# Patient Record
Sex: Male | Born: 1970 | Race: White | Hispanic: No | Marital: Married | Smoking: Never smoker
Health system: Southern US, Community
[De-identification: ages and names within clinical notes are randomized; demographics above are authoritative.]

## PROBLEM LIST (undated history)

## (undated) DIAGNOSIS — C6290 Malignant neoplasm of unspecified testis, unspecified whether descended or undescended: Secondary | ICD-10-CM

## (undated) DIAGNOSIS — N5089 Other specified disorders of the male genital organs: Secondary | ICD-10-CM

---

## 1998-01-11 HISTORY — PX: APPENDECTOMY: SHX54

## 1999-04-29 ENCOUNTER — Encounter: Payer: Self-pay | Admitting: Specialist

## 1999-04-29 ENCOUNTER — Ambulatory Visit (HOSPITAL_COMMUNITY): Admission: RE | Admit: 1999-04-29 | Discharge: 1999-04-29 | Payer: Self-pay | Admitting: Specialist

## 2013-01-26 ENCOUNTER — Encounter (HOSPITAL_BASED_OUTPATIENT_CLINIC_OR_DEPARTMENT_OTHER): Payer: Self-pay | Admitting: Emergency Medicine

## 2013-01-26 ENCOUNTER — Emergency Department (HOSPITAL_BASED_OUTPATIENT_CLINIC_OR_DEPARTMENT_OTHER)
Admission: EM | Admit: 2013-01-26 | Discharge: 2013-01-26 | Disposition: A | Discharge status: Home / Self Care | Payer: Worker's Compensation | Attending: Emergency Medicine | Admitting: Emergency Medicine

## 2013-01-26 ENCOUNTER — Emergency Department (HOSPITAL_BASED_OUTPATIENT_CLINIC_OR_DEPARTMENT_OTHER): Payer: Worker's Compensation

## 2013-01-26 DIAGNOSIS — Y939 Activity, unspecified: Secondary | ICD-10-CM | POA: Insufficient documentation

## 2013-01-26 DIAGNOSIS — Z23 Encounter for immunization: Secondary | ICD-10-CM | POA: Insufficient documentation

## 2013-01-26 DIAGNOSIS — Y990 Civilian activity done for income or pay: Secondary | ICD-10-CM | POA: Insufficient documentation

## 2013-01-26 DIAGNOSIS — W3189XA Contact with other specified machinery, initial encounter: Secondary | ICD-10-CM | POA: Insufficient documentation

## 2013-01-26 DIAGNOSIS — IMO0002 Reserved for concepts with insufficient information to code with codable children: Secondary | ICD-10-CM | POA: Diagnosis present

## 2013-01-26 DIAGNOSIS — S61209A Unspecified open wound of unspecified finger without damage to nail, initial encounter: Secondary | ICD-10-CM | POA: Insufficient documentation

## 2013-01-26 DIAGNOSIS — S61319A Laceration without foreign body of unspecified finger with damage to nail, initial encounter: Secondary | ICD-10-CM

## 2013-01-26 DIAGNOSIS — Y9289 Other specified places as the place of occurrence of the external cause: Secondary | ICD-10-CM | POA: Insufficient documentation

## 2013-01-26 DIAGNOSIS — S61219A Laceration without foreign body of unspecified finger without damage to nail, initial encounter: Secondary | ICD-10-CM

## 2013-01-26 MED ORDER — TETANUS-DIPHTH-ACELL PERTUSSIS 5-2.5-18.5 LF-MCG/0.5 IM SUSP
0.5000 mL | Freq: Once | INTRAMUSCULAR | Status: AC
Start: 2013-01-26 — End: 2013-01-26
  Administered 2013-01-26: 0.5 mL via INTRAMUSCULAR
  Filled 2013-01-26: qty 0.5

## 2013-01-26 MED ORDER — CEPHALEXIN 500 MG PO CAPS: 500.0000 mg | ORAL_CAPSULE | Freq: Four times a day (QID) | ORAL | Status: AC

## 2013-01-26 MED ORDER — OXYCODONE-ACETAMINOPHEN 5-325 MG PO TABS: 1.0000 | ORAL_TABLET | Freq: Four times a day (QID) | ORAL | Status: DC | PRN

## 2013-01-26 NOTE — ED Notes (Signed)
MD at bedside. 

## 2013-01-26 NOTE — ED Provider Notes (Signed)
CSN: [id number]     Arrival date & time 01/26/13  1004 History   First MD Initiated Contact with Patient 01/26/13 1026     Chief Complaint  Patient presents with  . Finger Injury    left index finger   (Consider location/radiation/quality/duration/timing/severity/associated sxs/prior Treatment) Patient is a 43 y.o. male presenting with hand pain. The history is provided by the patient.  Hand Pain This is a new problem. The current episode started 1 to 2 hours ago. Episode frequency: once. The problem has not changed since onset.Pertinent negatives include no chest pain, no abdominal pain, no headaches and no shortness of breath. Nothing aggravates the symptoms. Nothing relieves the symptoms. He has tried nothing for the symptoms. The treatment provided no relief.    History reviewed. No pertinent past medical history. Past Surgical History  Procedure Laterality Date  . Appendectomy     No family history on file. History  Substance Use Topics  . Smoking status: Never Smoker   . Smokeless tobacco: Never Used  . Alcohol Use: No    Review of Systems  Constitutional: Negative for fever.  HENT: Negative for drooling and rhinorrhea.   Eyes: Negative for pain.  Respiratory: Negative for cough and shortness of breath.   Cardiovascular: Negative for chest pain and leg swelling.  Gastrointestinal: Negative for nausea, vomiting, abdominal pain and diarrhea.  Genitourinary: Negative for dysuria and hematuria.  Musculoskeletal: Negative for gait problem and neck pain.  Skin: Negative for color change.  Neurological: Negative for numbness and headaches.  Hematological: Negative for adenopathy.  Psychiatric/Behavioral: Negative for behavioral problems.  All other systems reviewed and are negative.    Allergies  Review of patient's allergies indicates no known allergies.  Home Medications  No current outpatient prescriptions on file. BP 127/90  Temp(Src) 98.1 F (36.7 C) (Oral)   Resp 20  Ht 5\' 10"  (1.778 m)  Wt 190 lb (86.183 kg)  BMI 27.26 kg/m2  SpO2 100% Physical Exam  Nursing note and vitals reviewed. Constitutional: He is oriented to person, place, and time. He appears well-developed and well-nourished.  HENT:  Head: Normocephalic and atraumatic.  Right Ear: External ear normal.  Left Ear: External ear normal.  Nose: Nose normal.  Mouth/Throat: Oropharynx is clear and moist. No oropharyngeal exudate.  Eyes: Conjunctivae and EOM are normal. Pupils are equal, round, and reactive to light.  Neck: Normal range of motion. Neck supple.  Cardiovascular: Normal rate, regular rhythm, normal heart sounds and intact distal pulses.  Exam reveals no gallop and no friction rub.   No murmur heard. Pulmonary/Chest: Effort normal and breath sounds normal. No respiratory distress. He has no wheezes.  Abdominal: Soft. Bowel sounds are normal. He exhibits no distension. There is no tenderness. There is no rebound and no guarding.  Musculoskeletal: Normal range of motion. He exhibits no edema and no tenderness.  Normal range of motion of the digits of the left hand. 2+ distal pulses in the upper extremities. Sensation intact in the left upper extremity.  Avulsion of the base of the nail plate with mild avulsion of tissue from the proximal nail fold.  Approximately 1 cm horizontal laceration horizontally across the nailbed which is difficult to fully characterize w/ the nail plate intact.   Neurological: He is alert and oriented to person, place, and time.  Skin: Skin is warm and dry.  Psychiatric: He has a normal mood and affect. His behavior is normal.    ED Course  Procedures (including critical care  time) Labs Review Labs Reviewed - No data to display Imaging Review Dg Hand Complete Left  01/26/2013   CLINICAL DATA:  Pain status post trauma. Patient unable to remove wedding ring  EXAM: LEFT HAND - COMPLETE 3+ VIEW  COMPARISON:  None.  FINDINGS: There is no evidence  of fracture or dislocation. There is no evidence of arthropathy or other focal bone abnormality. Soft tissues are unremarkable.  IMPRESSION: No evidence of acute osseous abnormalities within the visualized osseous structures.   Electronically Signed   By: Margaree Mackintosh M.D.   On: 01/26/2013 11:17    EKG Interpretation   None      Left hand, index finger    MDM   1. Finger laceration   2. Laceration of nail bed of finger   3. Avulsion of nail plate    27:03 AM 43 y.o. male who presents with a left index finger injury which occurred prior to arrival. The patient states that he caught his finger in a printing machine mechanism. It caused an avulsion of the base of the nail plate of his left index finger. The patient has only minimal pain on exam and refuses any pain medicine. He does not remember his last tetanus shot so I will update this today. He is neurovascularly intact on exam. Will get plain films to rule out fracture.   No fx noted on imaging. I discussed the treatment options with the patient. I told him that I would like to remove the nail plate so that I can further examine the nailbed of his lacerated finger. That way I could further evaluate the laceration and repair it. He refuses removal of the nail and does not want me to anesthetize his finger. I told him that not doing this will likely effect regrowth of the nail as well as cosmesis. He understands and would not like me to repair the nailbed laceration. I irrigated the wound thoroughly w/ sterile water. Will apply bacitracin, bandage, and finger splint. Will provide keflex to help prevent infection.     I have discussed the diagnosis/risks/treatment options with the patient and believe the pt to be eligible for discharge home to follow-up with the hand specialist if he wants. We also discussed returning to the ED immediately if new or worsening sx occur. We discussed the sx which are most concerning (e.g., worsening pain,  concern for infection) that necessitate immediate return. Any new prescriptions provided to the patient are listed below.  Discharge Medication List as of 01/26/2013 12:09 PM    START taking these medications   Details  cephALEXin (KEFLEX) 500 MG capsule Take 1 capsule (500 mg total) by mouth 4 (four) times daily., Starting 01/26/2013, Last dose on Mon 02/05/13, Print    oxyCODONE-acetaminophen (PERCOCET) 5-325 MG per tablet Take 1 tablet by mouth every 6 (six) hours as needed for moderate pain., Starting 01/26/2013, Until Discontinued, Print          Blanchard Kelch, MD 01/27/13 [phone]

## 2013-01-26 NOTE — ED Notes (Signed)
Finger cleansed with NS, Bacitracin applied.  Finger splint applied per MD order.

## 2013-01-26 NOTE — ED Notes (Signed)
Patient states he caught his left index finger in a machine, ripping off part of the nail bed.

## 2014-05-14 ENCOUNTER — Other Ambulatory Visit: Payer: Self-pay | Admitting: Urology

## 2014-05-14 ENCOUNTER — Encounter (HOSPITAL_BASED_OUTPATIENT_CLINIC_OR_DEPARTMENT_OTHER): Payer: Self-pay | Admitting: *Deleted

## 2014-05-17 ENCOUNTER — Encounter (HOSPITAL_BASED_OUTPATIENT_CLINIC_OR_DEPARTMENT_OTHER): Payer: Self-pay | Admitting: *Deleted

## 2014-05-17 NOTE — H&P (Signed)
History of Present Illness   Dominic Anderson presents today at the consultation request from Corine Shelter, [place] at [facility] for further assessment of some right-sided testicular pain and swelling. Dominic Anderson is currently 44 years of age. He generally enjoys good health. He has no prior urologic history. Several months ago he began noticing some discomfort in his right hemi-scrotum/testicle. The pain was never severe, but was often an annoying pressure or discomfort. He was evaluated at [facility] and given a course of antiinflammatories. He was also given a prescription for an antibiotic to take if the antiinflammatory was not helpful. After several weeks he persisted in having at least some intermittent discomfort and, therefore, he took the antibiotic. Overall, the pain has not been as bad recently, but he has had a substantial increase in swelling over the last month in the right hemi-scrotum. No other voiding complaints or systemic concerns.      Past Medical History Problems  1. History of No significant past medical history  Surgical History Problems  1. History of Appendectomy  Current Meds 1. Advil TABS;  Therapy: (Recorded:29Apr2016) to Recorded  Allergies Medication  1. No Known Drug Allergies  Family History Problems  1. Family history of diabetes mellitus (Z83.3) : Father  Social History Problems    Alcohol use (Z78.9)   Caffeine use (F15.90)   Never a smoker   Number of children   Occupation   Separated from significant other (Z63.5)  Review of Systems Genitourinary, constitutional, skin, eye, otolaryngeal, hematologic/lymphatic, cardiovascular, pulmonary, endocrine, musculoskeletal, gastrointestinal, neurological and psychiatric system(s) were reviewed and pertinent findings if present are noted and are otherwise negative.  Genitourinary: testicular pain.    Vitals Vital Signs [Data Includes: Last 1 Day]  Recorded: 29Apr2016 01:36PM  Height: 5 ft 10  in Weight: 199 lb  BMI Calculated: 28.55 BSA Calculated: 2.08 Blood Pressure: 118 / 78 Heart Rate: 75  Physical Exam Constitutional: Well nourished and well developed . No acute distress.  ENT:. The ears and nose are normal in appearance.  Neck: The appearance of the neck is normal and no neck mass is present.  Pulmonary: No respiratory distress and normal respiratory rhythm and effort.  Cardiovascular: Heart rate and rhythm are normal . No peripheral edema.  Abdomen: The abdomen is soft and nontender. No masses are palpated. No CVA tenderness. No hernias are palpable. No hepatosplenomegaly noted.  Genitourinary: The scrotum is normal in appearance. Examination of the right scrotum demonstrates a hydrocele. The right epididymis is enlarged. The right testis is found to have a 6 cm right testicular mass. The left testis is normal.  Skin: Normal skin turgor, no visible rash and no visible skin lesions.  Neuro/Psych:. Mood and affect are appropriate.    Results/Data Urine [Data Includes: Last 1 Day]   29Apr2016  COLOR YELLOW   APPEARANCE CLEAR   SPECIFIC GRAVITY >1.030   pH 5.5   GLUCOSE NEG mg/dL  BILIRUBIN NEG   KETONE NEG mg/dL  BLOOD TRACE   PROTEIN NEG mg/dL  UROBILINOGEN 0.2 mg/dL  NITRITE NEG   LEUKOCYTE ESTERASE NEG   SQUAMOUS EPITHELIAL/HPF RARE   WBC 0-2 WBC/hpf  RBC 0-2 RBC/hpf  BACTERIA RARE   CRYSTALS NONE SEEN   CASTS NONE SEEN     Scrotal ultrasonography was performed today. The right testicle measured 6.5 x 5 cm. It was markedly inhomogeneous with areas of increased as well as reduced echogenicity. Multiple cystic areas were also noted. I was unable to  appreciate any significant normal testicular parenchyma. 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 No appreciable fluid noted. The left testicle measured 5.1 by approximately 2.8 cm. That testicle is completely homogenous in appearance and completely normal. 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    Assessment Assessed  1. Testicular pain (N50.8) 2. 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 SCROTAL U/S; Status:Resulted - Requires Verification;   Done: 29Apr2016 03:14PM  Discussion/Summary   Dominic Anderson has a markedly abnormal right testicle based on clinical exams as well as scrotal ultrasonography today. The radiographic images are consistent with testicular neoplasm. 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 We will obtain alpha-fetoprotein, LDH, and beta hCG levels today. I have explained to him that the standard of care in a situation like this where there is a markedly abnormal testis suspicious for germ-cell tumor to go ahead with a right radical/inguinal orchiectomy. Status post that procedure we would proceed with CT imaging of the abdomen and pelvis as well as probable chest. He is told that after that decision tree can get more complicated depending on whether this is a pure seminoma or mixed germ-cell tumor and what the components/constituents of the tumor are. Other factors would obviously include the radiographic staging and his response if markers are elevated to the surgical procedure. He does go to church with Dr Collier Salina _____ who will be an excellent oncologist for him if he does need that assistance/expertise. We will attempt to get something scheduled certainly over the next 1-2 weeks. We talked about some recovery issues and potential issues with regard to work.   cc: Corine Shelter, [place]      Signatures Electronically signed by : Rana Snare, M.D.; May 14 2014  6:00PM EST

## 2014-05-17 NOTE — Progress Notes (Signed)
To [facility] at 0630-Hg on arrival-Instructed Npo after MN-

## 2014-05-17 NOTE — Progress Notes (Signed)
Message left with [facility] have been unable to contact [place] his procedure on 05/20/14.

## 2014-05-20 ENCOUNTER — Ambulatory Visit (HOSPITAL_BASED_OUTPATIENT_CLINIC_OR_DEPARTMENT_OTHER): Payer: BLUE CROSS/BLUE SHIELD | Admitting: Anesthesiology

## 2014-05-20 ENCOUNTER — Ambulatory Visit (HOSPITAL_BASED_OUTPATIENT_CLINIC_OR_DEPARTMENT_OTHER)
Admission: RE | Admit: 2014-05-20 | Discharge: 2014-05-20 | Disposition: A | Discharge status: Home / Self Care | Payer: BLUE CROSS/BLUE SHIELD | Source: Ambulatory Visit | Attending: Urology | Admitting: Urology

## 2014-05-20 ENCOUNTER — Encounter (HOSPITAL_BASED_OUTPATIENT_CLINIC_OR_DEPARTMENT_OTHER)
Admission: RE | Disposition: A | Discharge status: Home / Self Care | Payer: Self-pay | Source: Ambulatory Visit | Attending: Urology

## 2014-05-20 ENCOUNTER — Encounter (HOSPITAL_BASED_OUTPATIENT_CLINIC_OR_DEPARTMENT_OTHER): Payer: Self-pay | Admitting: *Deleted

## 2014-05-20 DIAGNOSIS — C6290 Malignant neoplasm of unspecified testis, unspecified whether descended or undescended: Secondary | ICD-10-CM

## 2014-05-20 DIAGNOSIS — Z791 Long term (current) use of non-steroidal anti-inflammatories (NSAID): Secondary | ICD-10-CM | POA: Diagnosis not present

## 2014-05-20 DIAGNOSIS — C6291 Malignant neoplasm of right testis, unspecified whether descended or undescended: Secondary | ICD-10-CM | POA: Insufficient documentation

## 2014-05-20 DIAGNOSIS — N508 Other specified disorders of male genital organs: Secondary | ICD-10-CM | POA: Diagnosis present

## 2014-05-20 DIAGNOSIS — N433 Hydrocele, unspecified: Secondary | ICD-10-CM | POA: Diagnosis not present

## 2014-05-20 HISTORY — DX: Other specified disorders of the male genital organs: N50.89

## 2014-05-20 HISTORY — PX: ORCHIECTOMY: SHX2116

## 2014-05-20 LAB — POCT HEMOGLOBIN-HEMACUE: HEMOGLOBIN: 16.9 g/dL (ref 13.0–17.0)

## 2014-05-20 SURGERY — ORCHIECTOMY
Anesthesia: General | Laterality: Right

## 2014-05-20 MED ORDER — HYDROCODONE-ACETAMINOPHEN 5-325 MG PO TABS: 1.0000 | ORAL_TABLET | Freq: Four times a day (QID) | ORAL | Status: DC | PRN

## 2014-05-20 MED ORDER — MIDAZOLAM HCL 5 MG/5ML IJ SOLN
INTRAMUSCULAR | Status: DC | PRN
  Administered 2014-05-20 (×2): 1 mg via INTRAVENOUS

## 2014-05-20 MED ORDER — FENTANYL CITRATE (PF) 100 MCG/2ML IJ SOLN
INTRAMUSCULAR | Status: DC | PRN
  Administered 2014-05-20 (×9): 25 ug via INTRAVENOUS
  Administered 2014-05-20: 50 ug via INTRAVENOUS
  Administered 2014-05-20: 25 ug via INTRAVENOUS

## 2014-05-20 MED ORDER — CEFAZOLIN SODIUM-DEXTROSE 2-3 GM-% IV SOLR
INTRAVENOUS | Status: AC
  Filled 2014-05-20: qty 50

## 2014-05-20 MED ORDER — DEXAMETHASONE SODIUM PHOSPHATE 10 MG/ML IJ SOLN
INTRAMUSCULAR | Status: DC | PRN
  Administered 2014-05-20: 10 mg via INTRAVENOUS

## 2014-05-20 MED ORDER — BUPIVACAINE HCL (PF) 0.25 % IJ SOLN
INTRAMUSCULAR | Status: DC | PRN
  Administered 2014-05-20: 14 mL

## 2014-05-20 MED ORDER — LACTATED RINGERS IV SOLN
INTRAVENOUS | Status: DC
  Administered 2014-05-20 (×3): via INTRAVENOUS
  Filled 2014-05-20: qty 1000

## 2014-05-20 MED ORDER — HYDROMORPHONE HCL 1 MG/ML IJ SOLN
0.2500 mg | INTRAMUSCULAR | Status: DC | PRN
  Filled 2014-05-20: qty 1

## 2014-05-20 MED ORDER — MIDAZOLAM HCL 2 MG/2ML IJ SOLN
INTRAMUSCULAR | Status: AC
  Filled 2014-05-20: qty 2

## 2014-05-20 MED ORDER — KETOROLAC TROMETHAMINE 30 MG/ML IJ SOLN
30.0000 mg | Freq: Once | INTRAMUSCULAR | Status: DC | PRN
  Filled 2014-05-20: qty 1

## 2014-05-20 MED ORDER — OXYCODONE HCL 5 MG PO TABS
5.0000 mg | ORAL_TABLET | Freq: Once | ORAL | Status: DC | PRN
  Filled 2014-05-20: qty 1

## 2014-05-20 MED ORDER — CEFAZOLIN SODIUM-DEXTROSE 2-3 GM-% IV SOLR
2.0000 g | INTRAVENOUS | Status: AC
  Administered 2014-05-20: 2 g via INTRAVENOUS
  Filled 2014-05-20: qty 50

## 2014-05-20 MED ORDER — FENTANYL CITRATE (PF) 100 MCG/2ML IJ SOLN
INTRAMUSCULAR | Status: AC
  Filled 2014-05-20: qty 6

## 2014-05-20 MED ORDER — OXYCODONE HCL 5 MG/5ML PO SOLN
5.0000 mg | Freq: Once | ORAL | Status: DC | PRN
  Filled 2014-05-20: qty 5

## 2014-05-20 MED ORDER — ONDANSETRON HCL 4 MG/2ML IJ SOLN
INTRAMUSCULAR | Status: DC | PRN
  Administered 2014-05-20: 4 mg via INTRAVENOUS

## 2014-05-20 MED ORDER — CEFAZOLIN SODIUM 1-5 GM-% IV SOLN
1.0000 g | INTRAVENOUS | Status: DC
  Filled 2014-05-20: qty 50

## 2014-05-20 MED ORDER — GLYCOPYRROLATE 0.2 MG/ML IJ SOLN
INTRAMUSCULAR | Status: DC | PRN
  Administered 2014-05-20: 0.2 mg via INTRAVENOUS

## 2014-05-20 MED ORDER — PROMETHAZINE HCL 25 MG/ML IJ SOLN
6.2500 mg | INTRAMUSCULAR | Status: DC | PRN
  Filled 2014-05-20: qty 1

## 2014-05-20 MED ORDER — ACETAMINOPHEN 10 MG/ML IV SOLN
INTRAVENOUS | Status: DC | PRN
  Administered 2014-05-20: 1000 mg via INTRAVENOUS

## 2014-05-20 MED ORDER — LIDOCAINE HCL (CARDIAC) 20 MG/ML IV SOLN
INTRAVENOUS | Status: DC | PRN
  Administered 2014-05-20: 100 mg via INTRAVENOUS

## 2014-05-20 MED ORDER — PROPOFOL 10 MG/ML IV BOLUS
INTRAVENOUS | Status: DC | PRN
  Administered 2014-05-20: 200 mg via INTRAVENOUS

## 2014-05-20 MED ORDER — KETOROLAC TROMETHAMINE 30 MG/ML IJ SOLN
INTRAMUSCULAR | Status: DC | PRN
  Administered 2014-05-20: 30 mg via INTRAVENOUS

## 2014-05-20 SURGICAL SUPPLY — 69 items
APL SKNCLS STERI-STRIP NONHPOA (GAUZE/BANDAGES/DRESSINGS)
BENZOIN TINCTURE PRP APPL 2/3 (GAUZE/BANDAGES/DRESSINGS) IMPLANT
BLADE CLIPPER SURG (BLADE) ×3 IMPLANT
BLADE SURG 15 STRL LF DISP TIS (BLADE) ×1 IMPLANT
BLADE SURG 15 STRL SS (BLADE) ×3
BNDG GAUZE ELAST 4 BULKY (GAUZE/BANDAGES/DRESSINGS) ×3 IMPLANT
CLEANER CAUTERY TIP 5X5 PAD (MISCELLANEOUS) ×1 IMPLANT
CLEANER INSTRU ENZYM VALS 1GL (STERILIZATION PRODUCTS) ×3 IMPLANT
CLOSURE WOUND 1/2 X4 (GAUZE/BANDAGES/DRESSINGS) ×1
CLOTH BEACON ORANGE TIMEOUT ST (SAFETY) ×1 IMPLANT
COVER BACK TABLE 60X90IN (DRAPES) ×3 IMPLANT
COVER MAYO STAND STRL (DRAPES) ×3 IMPLANT
DISSECTOR ROUND CHERRY 3/8 STR (MISCELLANEOUS) ×2 IMPLANT
DRAIN PENROSE 18X1/4 LTX STRL (WOUND CARE) ×2 IMPLANT
DRAPE LAPAROTOMY TRNSV 102X78 (DRAPE) IMPLANT
DRAPE PED LAPAROTOMY (DRAPES) ×3 IMPLANT
DRSG TEGADERM 2-3/8X2-3/4 SM (GAUZE/BANDAGES/DRESSINGS) ×3 IMPLANT
DRSG TEGADERM 4X4.75 (GAUZE/BANDAGES/DRESSINGS) ×2 IMPLANT
ELECT NDL TIP 2.8 STRL (NEEDLE) ×1 IMPLANT
ELECT NEEDLE TIP 2.8 STRL (NEEDLE) ×3 IMPLANT
ELECT REM PT RETURN 9FT ADLT (ELECTROSURGICAL) ×3
ELECTRODE REM PT RTRN 9FT ADLT (ELECTROSURGICAL) ×1 IMPLANT
GLOVE BIO SURGEON STRL SZ7.5 (GLOVE) ×3 IMPLANT
GLOVE BIOGEL M 6.5 STRL (GLOVE) ×2 IMPLANT
GLOVE BIOGEL M STRL SZ7.5 (GLOVE) ×6 IMPLANT
GLOVE BIOGEL PI IND STRL 6.5 (GLOVE) ×1 IMPLANT
GLOVE BIOGEL PI IND STRL 7.5 (GLOVE) IMPLANT
GLOVE BIOGEL PI INDICATOR 6.5 (GLOVE) ×2
GLOVE BIOGEL PI INDICATOR 7.5 (GLOVE) ×2
GOWN L4 LG 24 PK N/S (GOWN DISPOSABLE) IMPLANT
GOWN STRL REUS W/ TWL LRG LVL3 (GOWN DISPOSABLE) ×1 IMPLANT
GOWN STRL REUS W/ TWL XL LVL3 (GOWN DISPOSABLE) IMPLANT
GOWN STRL REUS W/TWL LRG LVL3 (GOWN DISPOSABLE)
GOWN STRL REUS W/TWL XL LVL3 (GOWN DISPOSABLE) ×6
NDL HYPO 25X1 1.5 SAFETY (NEEDLE) ×1 IMPLANT
NEEDLE HYPO 25X1 1.5 SAFETY (NEEDLE) ×3 IMPLANT
NS IRRIG 500ML POUR BTL (IV SOLUTION) IMPLANT
PACK BASIN DAY SURGERY FS (CUSTOM PROCEDURE TRAY) ×3 IMPLANT
PAD CLEANER CAUTERY TIP 5X5 (MISCELLANEOUS) ×2
PENCIL BUTTON HOLSTER BLD 10FT (ELECTRODE) ×3 IMPLANT
STRIP CLOSURE SKIN 1/2X4 (GAUZE/BANDAGES/DRESSINGS) ×2 IMPLANT
SUPPORT SCROTAL LG STRP (MISCELLANEOUS) ×2 IMPLANT
SUPPORTER ATHLETIC LG (MISCELLANEOUS) ×1
SUT PROLENE 4 0 RB 1 (SUTURE)
SUT PROLENE 4-0 RB1 .5 CRCL 36 (SUTURE) IMPLANT
SUT SILK 0 SH 30 (SUTURE) ×6 IMPLANT
SUT SILK 0 TIES 10X30 (SUTURE) ×3 IMPLANT
SUT VIC AB 0 CT1 36 (SUTURE) ×8 IMPLANT
SUT VIC AB 2-0 CT1 (SUTURE) ×4 IMPLANT
SUT VIC AB 2-0 CT1 27 (SUTURE)
SUT VIC AB 2-0 CT1 TAPERPNT 27 (SUTURE) IMPLANT
SUT VIC AB 3-0 CT1 36 (SUTURE) IMPLANT
SUT VIC AB 3-0 SH 27 (SUTURE) ×3
SUT VIC AB 3-0 SH 27X BRD (SUTURE) ×1 IMPLANT
SUT VIC AB 4-0 BRD 54 (SUTURE) IMPLANT
SUT VIC AB 4-0 P-3 18XBRD (SUTURE) IMPLANT
SUT VIC AB 4-0 P3 18 (SUTURE)
SUT VIC AB 4-0 RB1 18 (SUTURE) IMPLANT
SUT VIC AB 5-0 P-3 18X BRD (SUTURE) IMPLANT
SUT VIC AB 5-0 P3 18 (SUTURE)
SUT VICRYL 4-0 PS2 18IN ABS (SUTURE) ×3 IMPLANT
SYR CONTROL 10ML LL (SYRINGE) ×3 IMPLANT
TAPE STRIPS DRAPE STRL (GAUZE/BANDAGES/DRESSINGS) ×2 IMPLANT
TOWEL OR 17X24 6PK STRL BLUE (TOWEL DISPOSABLE) ×6 IMPLANT
TRAY DSU PREP LF (CUSTOM PROCEDURE TRAY) ×3 IMPLANT
TUBE CONNECTING 12'X1/4 (SUCTIONS) ×1
TUBE CONNECTING 12X1/4 (SUCTIONS) ×2 IMPLANT
WATER STERILE IRR 500ML POUR (IV SOLUTION) IMPLANT
YANKAUER SUCT BULB TIP NO VENT (SUCTIONS) ×3 IMPLANT

## 2014-05-20 NOTE — Anesthesia Postprocedure Evaluation (Signed)
  Anesthesia Post-op Note  Patient: Dominic Anderson  Procedure(s) Performed: Procedure(s): RADICAL ORCHIECTOMY RIGHT  (Right)  Patient Location: PACU  Anesthesia Type:General  Level of Consciousness: awake and alert   Airway and Oxygen Therapy: Patient Spontanous Breathing  Post-op Pain: mild  Post-op Assessment: Post-op Vital signs reviewed  Post-op Vital Signs: Reviewed  Last Vitals:  Filed Vitals:   05/20/14 1130  BP: 126/69  Pulse: 68  Temp: 36.4 C  Resp: 18    Complications: No apparent anesthesia complications

## 2014-05-20 NOTE — Op Note (Signed)
Preoperative diagnosis: Right testicular mass Postoperative diagnosis: Same Procedure: Right radical orchiectomy   Surgeon: Bernestine Amass M.D.  Anesthesia: Gen.  Indications Mr. Burdette is 44 years of age. He presented recently as a referral to our office secondary to several months of some right-sided testicular pain and swelling. Clinical exam was very concerning for testicular malignancy. Ultrasound revealed a 7 cm inhomogeneous mass replacing the right testicle. Subsequent alpha-fetoprotein level was noted to be 3500. The patient now presents for right inguinal radical orchiectomy. Risks benefits discussed with the patient. Full informed consent obtained.     Technique and findings: Patient was brought the operating room where he had successful induction of general anesthesia. He was placed in supine position and prepped and draped in usual manner. Appropriate surgical timeout was performed. Right inguinal incision was performed over the level of the internal ring. Incision was carried down to the external oblique fascia. External ring was identified and then opened in the direction of its fibers. Ilioinguinal nerve was identified and preserved. The spermatic cord itself appeared unremarkable. At the level of the pubic tubercle it was encircled with a Penrose drain. The right testicle was delivered and separated from gubernacular structures. Evaluation revealed a very firm mass essentially replacing any normal testis. The clamps spermatic cord was then taken up to the internal ring. There was divided into 2 bundles. Suture ligature was performed with silk suture. Ties were left purposely long for potential identification. Copious irrigation of the inguinal wound was performed. Marcaine was instilled in the floor of the inguinal canal and also in the skin. External oblique fascia was closed with a running 0 Vicryl suture. Superficial fascia was closed with a running Vicryl suture and skin closed with a  subcuticular running suture. No obvious Occasions or problems occurred and the patient was brought to recovery room in stable condition.

## 2014-05-20 NOTE — Transfer of Care (Signed)
  Last Vitals:  Filed Vitals:   05/20/14 0652  BP: 123/69  Pulse: 66  Temp: 36.4 C  Resp: 16   Immediate Anesthesia Transfer of Care Note  Patient: Dominic Anderson  Procedure(s) Performed: Procedure(s) (LRB): RADICAL ORCHIECTOMY RIGHT  (Right)  Patient Location: PACU  Anesthesia Type: General  Level of Consciousness: sleepy  Airway & Oxygen Therapy: Patient Spontanous Breathing and Patient connected to face mask oxygen, oral airway in place  Post-op Assessment: Report given to PACU RN and Post -op Vital signs reviewed and stable  Post vital signs: Reviewed and stable  Complications: No apparent anesthesia complications

## 2014-05-20 NOTE — Anesthesia Procedure Notes (Signed)
Procedure Name: LMA Insertion Date/Time: 05/20/2014 7:57 AM Performed by: Justice Rocher Pre-anesthesia Checklist: Patient identified, Emergency Drugs available, Suction available and Patient being monitored Patient Re-evaluated:Patient Re-evaluated prior to inductionOxygen Delivery Method: Circle System Utilized Preoxygenation: Pre-oxygenation with 100% oxygen Intubation Type: IV induction Ventilation: Mask ventilation without difficulty LMA: LMA inserted LMA Size: 5.0 Number of attempts: 1 Airway Equipment and Method: Bite block Placement Confirmation: positive ETCO2 Tube secured with: Tape Dental Injury: Teeth and Oropharynx as per pre-operative assessment

## 2014-05-20 NOTE — Interval H&P Note (Signed)
History and Physical Interval Note:  05/20/2014 7:47 AM  Dominic Anderson  has presented today for surgery, with the diagnosis of RIGHT TESTICULAR MASS  The various methods of treatment have been discussed with the patient and family. After consideration of risks, benefits and other options for treatment, the patient has consented to  Procedure(s): ORCHIECTOMY RIGHT  (Right) as a surgical intervention .  The patient's history has been reviewed, patient examined, no change in status, stable for surgery.  I have reviewed the patient's chart and labs.  Questions were answered to the patient's satisfaction.     Elzena Muston S

## 2014-05-20 NOTE — Discharge Instructions (Addendum)
No driving 5 days Keep ice on inguinal and scrotal area over the next 48 hours No lifting greater than 10-15 pounds 6 weeks May resume light duty activity in one week. Ambulation encouraged. Be sure to take a stool softener if you're taking the pain medication. May shower after 48 hours. Can remove clear plastic dressing in 48-72 hours. Leave Steri-Strips on until falls off    Call your surgeon if you experience:   1.  Fever over 101.0. 2.  Inability to urinate. 3.  Nausea and/or vomiting. 4.  Extreme swelling or bruising at the surgical site. 5.  Continued bleeding from the incision. 6.  Increased pain, redness or drainage from the incision. 7.  Problems related to your pain medication. 8. Any change in color, movement and/or sensation 9. Any problems and/or concerns    Post Anesthesia Home Care Instructions  Activity: Get plenty of rest for the remainder of the day. A responsible adult should stay with you for 24 hours following the procedure.  For the next 24 hours, DO NOT: -Drive a car -Paediatric nurse -Drink alcoholic beverages -Take any medication unless instructed by your physician -Make any legal decisions or sign important papers.  Meals: Start with liquid foods such as gelatin or soup. Progress to regular foods as tolerated. Avoid greasy, spicy, heavy foods. If nausea and/or vomiting occur, drink only clear liquids until the nausea and/or vomiting subsides. Call your physician if vomiting continues.  Special Instructions/Symptoms: Your throat may feel dry or sore from the anesthesia or the breathing tube placed in your throat during surgery. If this causes discomfort, gargle with warm salt water. The discomfort should disappear within 24 hours.  If you had a scopolamine patch placed behind your ear for the management of post- operative nausea and/or vomiting:  1. The medication in the patch is effective for 72 hours, after which it should be removed.  Wrap  patch in a tissue and discard in the trash. Wash hands thoroughly with soap and water. 2. You may remove the patch earlier than 72 hours if you experience unpleasant side effects which may include dry mouth, dizziness or visual disturbances. 3. Avoid touching the patch. Wash your hands with soap and water after contact with the patch.

## 2014-05-20 NOTE — Anesthesia Preprocedure Evaluation (Addendum)
Anesthesia Evaluation  Patient identified by MRN, date of birth, ID band Patient awake    Reviewed: Allergy & Precautions, NPO status , Patient's Chart, lab work & pertinent test results  Airway Mallampati: II  TM Distance: >3 FB Neck ROM: Full    Dental  (+) Teeth Intact, Dental Advisory Given   Pulmonary neg pulmonary ROS,  breath sounds clear to auscultation        Cardiovascular negative cardio ROS  Rhythm:Regular Rate:Normal     Neuro/Psych negative neurological ROS     GI/Hepatic negative GI ROS, Neg liver ROS,   Endo/Other  negative endocrine ROS  Renal/GU negative Renal ROS   Testicular mass    Musculoskeletal   Abdominal   Peds  Hematology negative hematology ROS (+)   Anesthesia Other Findings   Reproductive/Obstetrics                            Anesthesia Physical Anesthesia Plan  ASA: II  Anesthesia Plan: General   Post-op Pain Management:    Induction: Intravenous  Airway Management Planned: LMA  Additional Equipment:   Intra-op Plan:   Post-operative Plan:   Informed Consent: I have reviewed the patients History and Physical, chart, labs and discussed the procedure including the risks, benefits and alternatives for the proposed anesthesia with the patient or authorized representative who has indicated his/her understanding and acceptance.   Dental advisory given  Plan Discussed with: CRNA  Anesthesia Plan Comments:         Anesthesia Quick Evaluation

## 2014-05-21 ENCOUNTER — Encounter (HOSPITAL_BASED_OUTPATIENT_CLINIC_OR_DEPARTMENT_OTHER): Payer: Self-pay | Admitting: Urology

## 2014-05-29 ENCOUNTER — Telehealth: Payer: Self-pay | Admitting: Hematology & Oncology

## 2014-05-29 NOTE — Telephone Encounter (Signed)
I spoke w NEW PATIENT today to remind them of their appointment with Dr. Ennever. Also, advised them to bring all medication bottles and insurance card information. ° °

## 2014-06-13 ENCOUNTER — Telehealth: Payer: Self-pay | Admitting: Hematology & Oncology

## 2014-06-13 NOTE — Telephone Encounter (Signed)
I spoke w NEW PATIENT today to remind them of their appointment with Dr. Ennever. Also, advised them to bring all medication bottles and insurance card information. ° °

## 2014-06-14 ENCOUNTER — Ambulatory Visit: Payer: BLUE CROSS/BLUE SHIELD

## 2014-06-14 ENCOUNTER — Other Ambulatory Visit (HOSPITAL_BASED_OUTPATIENT_CLINIC_OR_DEPARTMENT_OTHER): Payer: BLUE CROSS/BLUE SHIELD

## 2014-06-14 ENCOUNTER — Other Ambulatory Visit: Payer: Self-pay

## 2014-06-14 ENCOUNTER — Encounter: Payer: Self-pay | Admitting: Hematology & Oncology

## 2014-06-14 ENCOUNTER — Ambulatory Visit (HOSPITAL_BASED_OUTPATIENT_CLINIC_OR_DEPARTMENT_OTHER): Payer: BLUE CROSS/BLUE SHIELD | Admitting: Hematology & Oncology

## 2014-06-14 VITALS — BP 133/79 | HR 90 | Temp 97.5°F | Resp 16 | Ht 70.0 in | Wt 202.0 lb

## 2014-06-14 DIAGNOSIS — C779 Secondary and unspecified malignant neoplasm of lymph node, unspecified: Secondary | ICD-10-CM | POA: Diagnosis not present

## 2014-06-14 DIAGNOSIS — C6290 Malignant neoplasm of unspecified testis, unspecified whether descended or undescended: Secondary | ICD-10-CM

## 2014-06-14 DIAGNOSIS — C6291 Malignant neoplasm of right testis, unspecified whether descended or undescended: Secondary | ICD-10-CM

## 2014-06-14 LAB — CBC WITH DIFFERENTIAL (CANCER CENTER ONLY)
BASO#: 0 10*3/uL (ref 0.0–0.2)
BASO%: 0.6 % (ref 0.0–2.0)
EOS ABS: 0.2 10*3/uL (ref 0.0–0.5)
EOS%: 2.5 % (ref 0.0–7.0)
HCT: 45.3 % (ref 38.7–49.9)
HGB: 15.9 g/dL (ref 13.0–17.1)
LYMPH#: 2 10*3/uL (ref 0.9–3.3)
LYMPH%: 28.9 % (ref 14.0–48.0)
MCH: 32.7 pg (ref 28.0–33.4)
MCHC: 35.1 g/dL (ref 32.0–35.9)
MCV: 93 fL (ref 82–98)
MONO#: 0.7 10*3/uL (ref 0.1–0.9)
MONO%: 9.9 % (ref 0.0–13.0)
NEUT#: 3.9 10*3/uL (ref 1.5–6.5)
NEUT%: 58.1 % (ref 40.0–80.0)
PLATELETS: 191 10*3/uL (ref 145–400)
RBC: 4.86 10*6/uL (ref 4.20–5.70)
RDW: 11.7 % (ref 11.1–15.7)
WBC: 6.8 10*3/uL (ref 4.0–10.0)

## 2014-06-14 NOTE — Progress Notes (Signed)
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 New Evaluation  : Dominic Anderson is a very nice 44 year old white gentleman. I know him from church he has been incredibly healthy. He has had testicular cancer.Marland Kitchen  HPI: Dominic Anderson, is well-known to me.  He has been in great shape. 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 He works out. He fishes and hunts. He plays softball.  He noticed a few months ago that he began to have some intermittent pain in the right groin. He did not note any swelling. The pain was not associated with any activity.  He was initially treated with some antibiotics. This did not work. He then began to note that the right Anderson became enlarged. He said that it grew quite quickly and was as big as a baseball.  He was then referred to ultrasound. This showed a to secure mass.  He was then referred to urology. He saw Dr. Wilburn Anderson. Dominic Anderson did a CT scan. I resume that this was done in the office area and for what Dominic Anderson says, that there is no evidence of any disease elsewhere.  Tumor markers were done which showed an elevated beta hCG of 554. His alpha-fetoprotein was 3528.  He underwent an orchiectomy. This was done on May 9. The pathology report ([id number]) showed a mixed germ cell tumor. It was 6.5 cm. 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 He had 20% embryonal cell. He had 20% yolk sac. He had 2% choriocarcinoma. There was some focal lymphovascular invasion. All margins were negative.  He was a stage IB nonseminomatous terms cell tumor.  His recovery has been non-complicated. He has not been able to work out. He is back to work.  He is eating well.  He denies any type of testicular trauma when he was younger. He says that his Anderson had always been distended .  He does not smoke.  Overall, his performance status is ECOG 0.    Past Medical History  Diagnosis Date  .  Testicular mass     right  :  Past Surgical History  Procedure Laterality Date  . Appendectomy  2000  . Orchiectomy Right 05/20/2014    Procedure: RADICAL ORCHIECTOMY RIGHT ;  Surgeon: Dominic Snare, MD;  Location: [facility];  Service: Urology;  Laterality: Right;  :  No current outpatient prescriptions on file.:  :  No Known Allergies:  History reviewed. No pertinent family history.:  History   Social History  . Marital Status: Married    Spouse Name: N/A  . Number of Children: N/A  . Years of Education: N/A   Occupational History  . Not on file.   Social History Main Topics  . Smoking status: Never Smoker   . Smokeless tobacco: Never Used  . Alcohol Use: No  . Drug Use: No  . Sexual Activity: Not on file   Other Topics Concern  . Not on file   Social History Narrative  :  Pertinent items are noted in HPI.  Exam: @IPVITALS @ Well developed and well-nourished white gentleman in no obvious distress. Vital signs show a temperature of   97.5. Pulse 90. Blood pressure 133/79. Weight is 202 pounds. Head and neck exam shows no ocular or oral lesions. There are no palpable cervical or supraclavicular lymph nodes. Lungs are clear bilaterally. Cardiac exam regular rate and rhythm with no  murmurs, rubs or bruits. Abdomen is soft. Has good bowel sounds. There is no fluid wave. There is no palpable liver or spleen tip. He has a healing right inguinal orchiectomy scar. Back exam shows no tenderness over the spine, ribs or hips. Extremities shows no clubbing, cyanosis or edema. Skin exam shows no rashes, ecchymoses or petechia. Neurological exam is nonfocal.    Recent Labs  06/14/14 1150  WBC 6.8  HGB 15.9  HCT 45.3  PLT 191   No results for input(s): NA, K, CL, CO2, GLUCOSE, BUN, CREATININE, CALCIUM in the last 72 hours.  Blood smear review:none  Pathology: See above     Assessment and Plan:  Dominic Anderson is a 44 year old gentleman with a stage I  non-seminomatous germ cell tumor. I would put him at a higher risk for recurrence because of the lymphovascular space invasion. Thankfully he does not have T3 orT4 disease. His tumor markers are elevated but I'll think that this would be a factor with respect to recurrence. We did recheck his levels today and I would assume that they should be coming down nicely.  I think that with the studies that have been done in early stage non-seminomatous germ cell tumors, I think that Dominic Anderson would be a good candidate for adjuvant therapy. I think that one cycle of BEP would be appropriate for him. Some clinical trials have confirmed the effectiveness of one cycle of BEP after orchiectomy.  I talked to him for about an hour. I explained to him why thought he would be benefited by treatment. He certainly would tolerated. He is incredibly healthy.  I think with one cycle of treatment, he does not need any pulmonary function studies.  I think that we can get started in the next couple weeks. I spoke to him about side effects. I taught him about hair loss, fatigue, bleeding, nausea and vomiting, weight loss. Again that with just one cycle of treatment, I just cannot imagine that he would have any problems.  Again, we will go ahead and get started on June 13. He agrees to start treatment.  I will plan to see him back myself after he gets done with his treatment. I'm sure that I will see him in church.

## 2014-06-14 NOTE — Patient Instructions (Signed)
Cisplatin injection What is this medicine? CISPLATIN (SIS pla tin) is a chemotherapy drug. It targets fast dividing cells, like cancer cells, and causes these cells to die. This medicine is used to treat many types of cancer like bladder, ovarian, and testicular cancers. This medicine may be used for other purposes; ask your health care provider or pharmacist if you have questions. COMMON BRAND NAME(S): Platinol, Platinol -AQ What should I tell my health care provider before I take this medicine? They need to know if you have any of these conditions: -blood disorders -hearing problems -kidney disease -recent or ongoing radiation therapy -an unusual or allergic reaction to cisplatin, carboplatin, other chemotherapy, other medicines, foods, dyes, or preservatives -pregnant or trying to get pregnant -breast-feeding How should I use this medicine? This drug is given as an infusion into a vein. It is administered in a hospital or clinic by a specially trained health care professional. Talk to your pediatrician regarding the use of this medicine in children. Special care may be needed. Overdosage: If you think you have taken too much of this medicine contact a poison control center or emergency room at once. NOTE: This medicine is only for you. Do not share this medicine with others. What if I miss a dose? It is important not to miss a dose. Call your doctor or health care professional if you are unable to keep an appointment. What may interact with this medicine? -dofetilide -foscarnet -medicines for seizures -medicines to increase blood counts like filgrastim, pegfilgrastim, sargramostim -probenecid -pyridoxine used with altretamine -rituximab -some antibiotics like amikacin, gentamicin, neomycin, polymyxin B, streptomycin, tobramycin -sulfinpyrazone -vaccines -zalcitabine Talk to your doctor or health care professional before taking any of these  medicines: -acetaminophen -aspirin -ibuprofen -ketoprofen -naproxen This list may not describe all possible interactions. Give your health care provider a list of all the medicines, herbs, non-prescription drugs, or dietary supplements you use. Also tell them if you smoke, drink alcohol, or use illegal drugs. Some items may interact with your medicine. What should I watch for while using this medicine? Your condition will be monitored carefully while you are receiving this medicine. You will need important blood work done while you are taking this medicine. This drug may make you feel generally unwell. This is not uncommon, as chemotherapy can affect healthy cells as well as cancer cells. Report any side effects. Continue your course of treatment even though you feel ill unless your doctor tells you to stop. In some cases, you may be given additional medicines to help with side effects. Follow all directions for their use. Call your doctor or health care professional for advice if you get a fever, chills or sore throat, or other symptoms of a cold or flu. Do not treat yourself. This drug decreases your body's ability to fight infections. Try to avoid being around people who are sick. This medicine may increase your risk to bruise or bleed. Call your doctor or health care professional if you notice any unusual bleeding. Be careful brushing and flossing your teeth or using a toothpick because you may get an infection or bleed more easily. If you have any dental work done, tell your dentist you are receiving this medicine. Avoid taking products that contain aspirin, acetaminophen, ibuprofen, naproxen, or ketoprofen unless instructed by your doctor. These medicines may hide a fever. Do not become pregnant while taking this medicine. Women should inform their doctor if they wish to become pregnant or think they might be pregnant. There is a   potential for serious side effects to an unborn child. Talk to  your health care professional or pharmacist for more information. Do not breast-feed an infant while taking this medicine. Drink fluids as directed while you are taking this medicine. This will help protect your kidneys. Call your doctor or health care professional if you get diarrhea. Do not treat yourself. What side effects may I notice from receiving this medicine? Side effects that you should report to your doctor or health care professional as soon as possible: -allergic reactions like skin rash, itching or hives, swelling of the face, lips, or tongue -signs of infection - fever or chills, cough, sore throat, pain or difficulty passing urine -signs of decreased platelets or bleeding - bruising, pinpoint red spots on the skin, black, tarry stools, nosebleeds -signs of decreased red blood cells - unusually weak or tired, fainting spells, lightheadedness -breathing problems -changes in hearing -gout pain -low blood counts - This drug may decrease the number of white blood cells, red blood cells and platelets. You may be at increased risk for infections and bleeding. -nausea and vomiting -pain, swelling, redness or irritation at the injection site -pain, tingling, numbness in the hands or feet -problems with balance, movement -trouble passing urine or change in the amount of urine Side effects that usually do not require medical attention (report to your doctor or health care professional if they continue or are bothersome): -changes in vision -loss of appetite -metallic taste in the mouth or changes in taste This list may not describe all possible side effects. Call your doctor for medical advice about side effects. You may report side effects to FDA at 1-800-FDA-1088. Where should I keep my medicine? This drug is given in a hospital or clinic and will not be stored at home. NOTE: This sheet is a summary. It may not cover all possible information. If you have questions about this medicine,  talk to your doctor, pharmacist, or health care provider.  2015, Elsevier/Gold Standard. (2007-04-04 14:40:54) Etoposide, VP-16 injection What is this medicine? ETOPOSIDE, VP-16 (e toe POE side) is a chemotherapy drug. It is used to treat testicular cancer, lung cancer, and other cancers. This medicine may be used for other purposes; ask your health care provider or pharmacist if you have questions. COMMON BRAND NAME(S): Etopophos, Toposar, VePesid What should I tell my health care provider before I take this medicine? They need to know if you have any of these conditions: -infection -kidney disease -low blood counts, like low white cell, platelet, or red cell counts -an unusual or allergic reaction to etoposide, other chemotherapeutic agents, other medicines, foods, dyes, or preservatives -pregnant or trying to get pregnant -breast-feeding How should I use this medicine? This medicine is for infusion into a vein. It is administered in a hospital or clinic by a specially trained health care professional. Talk to your pediatrician regarding the use of this medicine in children. Special care may be needed. Overdosage: If you think you have taken too much of this medicine contact a poison control center or emergency room at once. NOTE: This medicine is only for you. Do not share this medicine with others. What if I miss a dose? It is important not to miss your dose. Call your doctor or health care professional if you are unable to keep an appointment. What may interact with this medicine? -cyclosporine -medicines to increase blood counts like filgrastim, pegfilgrastim, sargramostim -vaccines This list may not describe all possible interactions. Give your health care provider  a list of all the medicines, herbs, non-prescription drugs, or dietary supplements you use. Also tell them if you smoke, drink alcohol, or use illegal drugs. Some items may interact with your medicine. What should I  watch for while using this medicine? Visit your doctor for checks on your progress. This drug may make you feel generally unwell. This is not uncommon, as chemotherapy can affect healthy cells as well as cancer cells. Report any side effects. Continue your course of treatment even though you feel ill unless your doctor tells you to stop. In some cases, you may be given additional medicines to help with side effects. Follow all directions for their use. Call your doctor or health care professional for advice if you get a fever, chills or sore throat, or other symptoms of a cold or flu. Do not treat yourself. This drug decreases your body's ability to fight infections. Try to avoid being around people who are sick. This medicine may increase your risk to bruise or bleed. Call your doctor or health care professional if you notice any unusual bleeding. Be careful brushing and flossing your teeth or using a toothpick because you may get an infection or bleed more easily. If you have any dental work done, tell your dentist you are receiving this medicine. Avoid taking products that contain aspirin, acetaminophen, ibuprofen, naproxen, or ketoprofen unless instructed by your doctor. These medicines may hide a fever. Do not become pregnant while taking this medicine. Women should inform their doctor if they wish to become pregnant or think they might be pregnant. There is a potential for serious side effects to an unborn child. Talk to your health care professional or pharmacist for more information. Do not breast-feed an infant while taking this medicine. What side effects may I notice from receiving this medicine? Side effects that you should report to your doctor or health care professional as soon as possible: -allergic reactions like skin rash, itching or hives, swelling of the face, lips, or tongue -low blood counts - this medicine may decrease the number of white blood cells, red blood cells and platelets.  You may be at increased risk for infections and bleeding. -signs of infection - fever or chills, cough, sore throat, pain or difficulty passing urine -signs of decreased platelets or bleeding - bruising, pinpoint red spots on the skin, black, tarry stools, blood in the urine -signs of decreased red blood cells - unusually weak or tired, fainting spells, lightheadedness -breathing problems -changes in vision -mouth or throat sores or ulcers -pain, redness, swelling or irritation at the injection site -pain, tingling, numbness in the hands or feet -redness, blistering, peeling or loosening of the skin, including inside the mouth -seizures -vomiting Side effects that usually do not require medical attention (report to your doctor or health care professional if they continue or are bothersome): -diarrhea -hair loss -loss of appetite -nausea -stomach pain This list may not describe all possible side effects. Call your doctor for medical advice about side effects. You may report side effects to FDA at 1-800-FDA-1088. Where should I keep my medicine? This drug is given in a hospital or clinic and will not be stored at home. NOTE: This sheet is a summary. It may not cover all possible information. If you have questions about this medicine, talk to your doctor, pharmacist, or health care provider.  2015, Elsevier/Gold Standard. (2007-05-01 17:24:12) Bleomycin injection What is this medicine? BLEOMYCIN (blee oh MYE sin) is a chemotherapy drug. It is used to  treat many kinds of cancer like lymphoma, cervical cancer, head and neck cancer, and testicular cancer. It is also used to prevent and to treat fluid build-up around the lungs caused by some cancers. This medicine may be used for other purposes; ask your health care provider or pharmacist if you have questions. COMMON BRAND NAME(S): Blenoxane What should I tell my health care provider before I take this medicine? They need to know if you have  any of these conditions: -cigarette smoker -kidney disease -lung disease -recent or ongoing radiation therapy -an unusual or allergic reaction to bleomycin, other chemotherapy agents, other medicines, foods, dyes, or preservatives -pregnant or trying to get pregnant -breast-feeding How should I use this medicine? This drug is given as an infusion into a vein or a body cavity. It can also be given as an injection into a muscle or under the skin. It is administered in a hospital or clinic by a specially trained health care professional. Talk to your pediatrician regarding the use of this medicine in children. Special care may be needed. Overdosage: If you think you have taken too much of this medicine contact a poison control center or emergency room at once. NOTE: This medicine is only for you. Do not share this medicine with others. What if I miss a dose? It is important not to miss your dose. Call your doctor or health care professional if you are unable to keep an appointment. What may interact with this medicine? -certain antibiotics given by injection -cisplatin -cyclosporine -diuretics -foscarnet -medicines to increase blood counts like filgrastim, pegfilgrastim, sargramostim -vaccines This list may not describe all possible interactions. Give your health care provider a list of all the medicines, herbs, non-prescription drugs, or dietary supplements you use. Also tell them if you smoke, drink alcohol, or use illegal drugs. Some items may interact with your medicine. What should I watch for while using this medicine? Visit your doctor for checks on your progress. This drug may make you feel generally unwell. This is not uncommon, as chemotherapy can affect healthy cells as well as cancer cells. Report any side effects. Continue your course of treatment even though you feel ill unless your doctor tells you to stop. Call your doctor or health care professional for advice if you get a  fever, chills or sore throat, or other symptoms of a cold or flu. Do not treat yourself. This drug decreases your body's ability to fight infections. Try to avoid being around people who are sick. Avoid taking products that contain aspirin, acetaminophen, ibuprofen, naproxen, or ketoprofen unless instructed by your doctor. These medicines may hide a fever. Do not become pregnant while taking this medicine. Women should inform their doctor if they wish to become pregnant or think they might be pregnant. There is a potential for serious side effects to an unborn child. Talk to your health care professional or pharmacist for more information. Do not breast-feed an infant while taking this medicine. There is a maximum amount of this medicine you should receive throughout your life. The amount depends on the medical condition being treated and your overall health. Your doctor will watch how much of this medicine you receive in your lifetime. Tell your doctor if you have taken this medicine before. What side effects may I notice from receiving this medicine? Side effects that you should report to your doctor or health care professional as soon as possible: -allergic reactions like skin rash, itching or hives, swelling of the face, lips, or  tongue -breathing problems -chest pain -confusion -cough -fast, irregular heartbeat -feeling faint or lightheaded, falls -fever or chills -mouth sores -pain, tingling, numbness in the hands or feet -trouble passing urine or change in the amount of urine -yellowing of the eyes or skin Side effects that usually do not require medical attention (report to your doctor or health care professional if they continue or are bothersome): -darker skin color -hair loss -irritation at site where injected -loss of appetite -nail changes -nausea and vomiting -weight loss This list may not describe all possible side effects. Call your doctor for medical advice about side  effects. You may report side effects to FDA at 1-800-FDA-1088. Where should I keep my medicine? This drug is given in a hospital or clinic and will not be stored at home. NOTE: This sheet is a summary. It may not cover all possible information. If you have questions about this medicine, talk to your doctor, pharmacist, or health care provider.  2015, Elsevier/Gold Standard. (2012-04-25 09:36:48)

## 2014-06-18 LAB — COMPREHENSIVE METABOLIC PANEL
ALBUMIN: 4.6 g/dL (ref 3.5–5.2)
ALK PHOS: 44 U/L (ref 39–117)
ALT: 34 U/L (ref 0–53)
AST: 18 U/L (ref 0–37)
BUN: 21 mg/dL (ref 6–23)
CHLORIDE: 102 meq/L (ref 96–112)
CO2: 27 meq/L (ref 19–32)
CREATININE: 0.86 mg/dL (ref 0.50–1.35)
Calcium: 9 mg/dL (ref 8.4–10.5)
Glucose, Bld: 94 mg/dL (ref 70–99)
Potassium: 4 mEq/L (ref 3.5–5.3)
SODIUM: 137 meq/L (ref 135–145)
Total Bilirubin: 0.5 mg/dL (ref 0.2–1.2)
Total Protein: 7 g/dL (ref 6.0–8.3)

## 2014-06-18 LAB — BETA HCG QUANT (REF LAB): Beta hCG, Tumor Marker: <2 m[IU]/mL (ref <5.0)

## 2014-06-18 LAB — AFP TUMOR MARKER: AFP TUMOR MARKER: 86.9 ng/mL — AB (ref <6.1)

## 2014-06-18 LAB — LACTATE DEHYDROGENASE: LDH: 141 U/L (ref 94–250)

## 2014-06-19 ENCOUNTER — Telehealth: Payer: Self-pay | Admitting: Hematology & Oncology

## 2014-06-19 NOTE — Telephone Encounter (Addendum)
Per [facility] e online prior review plan code approval portal for chemo. The codes listed below does not require prior auth and are billable codes w [employer] of [place].   J9040 BLEOMYCIN J9060 CISPLATIN J9181 ETOPOSIDE

## 2014-06-24 ENCOUNTER — Ambulatory Visit (HOSPITAL_BASED_OUTPATIENT_CLINIC_OR_DEPARTMENT_OTHER): Payer: BLUE CROSS/BLUE SHIELD

## 2014-06-24 VITALS — BP 116/61 | HR 48 | Temp 98.2°F | Resp 18

## 2014-06-24 DIAGNOSIS — Z5111 Encounter for antineoplastic chemotherapy: Secondary | ICD-10-CM | POA: Diagnosis not present

## 2014-06-24 DIAGNOSIS — C6291 Malignant neoplasm of right testis, unspecified whether descended or undescended: Secondary | ICD-10-CM

## 2014-06-24 MED ORDER — ETOPOSIDE CHEMO INJECTION 1 GM/50ML
100.0000 mg/m2 | Freq: Once | INTRAVENOUS | Status: AC
  Administered 2014-06-24: 210 mg via INTRAVENOUS
  Filled 2014-06-24: qty 10.5

## 2014-06-24 MED ORDER — PALONOSETRON HCL INJECTION 0.25 MG/5ML
0.2500 mg | Freq: Once | INTRAVENOUS | Status: AC
  Administered 2014-06-24: 0.25 mg via INTRAVENOUS

## 2014-06-24 MED ORDER — PALONOSETRON HCL INJECTION 0.25 MG/5ML
INTRAVENOUS | Status: AC
  Filled 2014-06-24: qty 5

## 2014-06-24 MED ORDER — DEXAMETHASONE 4 MG PO TABS: ORAL_TABLET | ORAL | Status: DC

## 2014-06-24 MED ORDER — POTASSIUM CHLORIDE 2 MEQ/ML IV SOLN
Freq: Once | INTRAVENOUS | Status: AC
  Administered 2014-06-24: 09:00:00 via INTRAVENOUS
  Filled 2014-06-24: qty 10

## 2014-06-24 MED ORDER — ONDANSETRON HCL 8 MG PO TABS: 8.0000 mg | ORAL_TABLET | Freq: Two times a day (BID) | ORAL | Status: DC | PRN

## 2014-06-24 MED ORDER — SODIUM CHLORIDE 0.9 % IV SOLN
Freq: Once | INTRAVENOUS | Status: AC
  Administered 2014-06-24: 12:00:00 via INTRAVENOUS
  Filled 2014-06-24: qty 5

## 2014-06-24 MED ORDER — PROCHLORPERAZINE MALEATE 10 MG PO TABS: 10.0000 mg | ORAL_TABLET | Freq: Four times a day (QID) | ORAL | Status: DC | PRN

## 2014-06-24 MED ORDER — SODIUM CHLORIDE 0.9 % IV SOLN
Freq: Once | INTRAVENOUS | Status: AC
  Administered 2014-06-24: 09:00:00 via INTRAVENOUS

## 2014-06-24 MED ORDER — LORAZEPAM 0.5 MG PO TABS: 0.5000 mg | ORAL_TABLET | Freq: Four times a day (QID) | ORAL | Status: DC | PRN

## 2014-06-24 MED ORDER — SODIUM CHLORIDE 0.9 % IV SOLN
20.0000 mg/m2 | Freq: Once | INTRAVENOUS | Status: AC
  Administered 2014-06-24: 43 mg via INTRAVENOUS
  Filled 2014-06-24: qty 43

## 2014-06-24 NOTE — Patient Instructions (Signed)
Cisplatin injection What is this medicine? CISPLATIN (SIS pla tin) is a chemotherapy drug. It targets fast dividing cells, like cancer cells, and causes these cells to die. This medicine is used to treat many types of cancer like bladder, ovarian, and testicular cancers. This medicine may be used for other purposes; ask your health care provider or pharmacist if you have questions. COMMON BRAND NAME(S): Platinol, Platinol -AQ What should I tell my health care provider before I take this medicine? They need to know if you have any of these conditions: -blood disorders -hearing problems -kidney disease -recent or ongoing radiation therapy -an unusual or allergic reaction to cisplatin, carboplatin, other chemotherapy, other medicines, foods, dyes, or preservatives -pregnant or trying to get pregnant -breast-feeding How should I use this medicine? This drug is given as an infusion into a vein. It is administered in a hospital or clinic by a specially trained health care professional. Talk to your pediatrician regarding the use of this medicine in children. Special care may be needed. Overdosage: If you think you have taken too much of this medicine contact a poison control center or emergency room at once. NOTE: This medicine is only for you. Do not share this medicine with others. What if I miss a dose? It is important not to miss a dose. Call your doctor or health care professional if you are unable to keep an appointment. What may interact with this medicine? -dofetilide -foscarnet -medicines for seizures -medicines to increase blood counts like filgrastim, pegfilgrastim, sargramostim -probenecid -pyridoxine used with altretamine -rituximab -some antibiotics like amikacin, gentamicin, neomycin, polymyxin B, streptomycin, tobramycin -sulfinpyrazone -vaccines -zalcitabine Talk to your doctor or health care professional before taking any of these  medicines: -acetaminophen -aspirin -ibuprofen -ketoprofen -naproxen This list may not describe all possible interactions. Give your health care provider a list of all the medicines, herbs, non-prescription drugs, or dietary supplements you use. Also tell them if you smoke, drink alcohol, or use illegal drugs. Some items may interact with your medicine. What should I watch for while using this medicine? Your condition will be monitored carefully while you are receiving this medicine. You will need important blood work done while you are taking this medicine. This drug may make you feel generally unwell. This is not uncommon, as chemotherapy can affect healthy cells as well as cancer cells. Report any side effects. Continue your course of treatment even though you feel ill unless your doctor tells you to stop. In some cases, you may be given additional medicines to help with side effects. Follow all directions for their use. Call your doctor or health care professional for advice if you get a fever, chills or sore throat, or other symptoms of a cold or flu. Do not treat yourself. This drug decreases your body's ability to fight infections. Try to avoid being around people who are sick. This medicine may increase your risk to bruise or bleed. Call your doctor or health care professional if you notice any unusual bleeding. Be careful brushing and flossing your teeth or using a toothpick because you may get an infection or bleed more easily. If you have any dental work done, tell your dentist you are receiving this medicine. Avoid taking products that contain aspirin, acetaminophen, ibuprofen, naproxen, or ketoprofen unless instructed by your doctor. These medicines may hide a fever. Do not become pregnant while taking this medicine. Women should inform their doctor if they wish to become pregnant or think they might be pregnant. There is a   potential for serious side effects to an unborn child. Talk to  your health care professional or pharmacist for more information. Do not breast-feed an infant while taking this medicine. Drink fluids as directed while you are taking this medicine. This will help protect your kidneys. Call your doctor or health care professional if you get diarrhea. Do not treat yourself. What side effects may I notice from receiving this medicine? Side effects that you should report to your doctor or health care professional as soon as possible: -allergic reactions like skin rash, itching or hives, swelling of the face, lips, or tongue -signs of infection - fever or chills, cough, sore throat, pain or difficulty passing urine -signs of decreased platelets or bleeding - bruising, pinpoint red spots on the skin, black, tarry stools, nosebleeds -signs of decreased red blood cells - unusually weak or tired, fainting spells, lightheadedness -breathing problems -changes in hearing -gout pain -low blood counts - This drug may decrease the number of white blood cells, red blood cells and platelets. You may be at increased risk for infections and bleeding. -nausea and vomiting -pain, swelling, redness or irritation at the injection site -pain, tingling, numbness in the hands or feet -problems with balance, movement -trouble passing urine or change in the amount of urine Side effects that usually do not require medical attention (report to your doctor or health care professional if they continue or are bothersome): -changes in vision -loss of appetite -metallic taste in the mouth or changes in taste This list may not describe all possible side effects. Call your doctor for medical advice about side effects. You may report side effects to FDA at 1-800-FDA-1088. Where should I keep my medicine? This drug is given in a hospital or clinic and will not be stored at home. NOTE: This sheet is a summary. It may not cover all possible information. If you have questions about this medicine,  talk to your doctor, pharmacist, or health care provider.  2015, [employer] Standard. (2007-04-04 14:40:54)  Etoposide, VP-16 injection What is this medicine? ETOPOSIDE, VP-16 (e toe POE side) is a chemotherapy drug. It is used to treat testicular cancer, lung cancer, and other cancers. This medicine may be used for other purposes; ask your health care provider or pharmacist if you have questions. COMMON BRAND NAME(S): Etopophos, Toposar, VePesid What should I tell my health care provider before I take this medicine? They need to know if you have any of these conditions: -infection -kidney disease -low blood counts, like low white cell, platelet, or red cell counts -an unusual or allergic reaction to etoposide, other chemotherapeutic agents, other medicines, foods, dyes, or preservatives -pregnant or trying to get pregnant -breast-feeding How should I use this medicine? This medicine is for infusion into a vein. It is administered in a hospital or clinic by a specially trained health care professional. Talk to your pediatrician regarding the use of this medicine in children. Special care may be needed. Overdosage: If you think you have taken too much of this medicine contact a poison control center or emergency room at once. NOTE: This medicine is only for you. Do not share this medicine with others. What if I miss a dose? It is important not to miss your dose. Call your doctor or health care professional if you are unable to keep an appointment. What may interact with this medicine? -cyclosporine -medicines to increase blood counts like filgrastim, pegfilgrastim, sargramostim -vaccines This list may not describe all possible interactions. Give your health care  provider a list of all the medicines, herbs, non-prescription drugs, or dietary supplements you use. Also tell them if you smoke, drink alcohol, or use illegal drugs. Some items may interact with your medicine. What should I  watch for while using this medicine? Visit your doctor for checks on your progress. This drug may make you feel generally unwell. This is not uncommon, as chemotherapy can affect healthy cells as well as cancer cells. Report any side effects. Continue your course of treatment even though you feel ill unless your doctor tells you to stop. In some cases, you may be given additional medicines to help with side effects. Follow all directions for their use. Call your doctor or health care professional for advice if you get a fever, chills or sore throat, or other symptoms of a cold or flu. Do not treat yourself. This drug decreases your body's ability to fight infections. Try to avoid being around people who are sick. This medicine may increase your risk to bruise or bleed. Call your doctor or health care professional if you notice any unusual bleeding. Be careful brushing and flossing your teeth or using a toothpick because you may get an infection or bleed more easily. If you have any dental work done, tell your dentist you are receiving this medicine. Avoid taking products that contain aspirin, acetaminophen, ibuprofen, naproxen, or ketoprofen unless instructed by your doctor. These medicines may hide a fever. Do not become pregnant while taking this medicine. Women should inform their doctor if they wish to become pregnant or think they might be pregnant. There is a potential for serious side effects to an unborn child. Talk to your health care professional or pharmacist for more information. Do not breast-feed an infant while taking this medicine. What side effects may I notice from receiving this medicine? Side effects that you should report to your doctor or health care professional as soon as possible: -allergic reactions like skin rash, itching or hives, swelling of the face, lips, or tongue -low blood counts - this medicine may decrease the number of white blood cells, red blood cells and platelets.  You may be at increased risk for infections and bleeding. -signs of infection - fever or chills, cough, sore throat, pain or difficulty passing urine -signs of decreased platelets or bleeding - bruising, pinpoint red spots on the skin, black, tarry stools, blood in the urine -signs of decreased red blood cells - unusually weak or tired, fainting spells, lightheadedness -breathing problems -changes in vision -mouth or throat sores or ulcers -pain, redness, swelling or irritation at the injection site -pain, tingling, numbness in the hands or feet -redness, blistering, peeling or loosening of the skin, including inside the mouth -seizures -vomiting Side effects that usually do not require medical attention (report to your doctor or health care professional if they continue or are bothersome): -diarrhea -hair loss -loss of appetite -nausea -stomach pain This list may not describe all possible side effects. Call your doctor for medical advice about side effects. You may report side effects to FDA at 1-800-FDA-1088. Where should I keep my medicine? This drug is given in a hospital or clinic and will not be stored at home. NOTE: This sheet is a summary. It may not cover all possible information. If you have questions about this medicine, talk to your doctor, pharmacist, or health care provider.  2015, [employer] Standard. (2007-05-01 17:24:12)  Palonosetron Injection What is this medicine? PALONOSETRON (pal oh NOE se tron) is used to prevent nausea  and vomiting caused by chemotherapy. It also helps prevent delayed nausea and vomiting that may occur a few days after your treatment. This medicine may be used for other purposes; ask your health care provider or pharmacist if you have questions. COMMON BRAND NAME(S): Aloxi What should I tell my health care provider before I take this medicine? They need to know if you have any of these conditions: -an unusual or allergic reaction to  palonosetron, dolasetron, granisetron, ondansetron, other medicines, foods, dyes, or preservatives -pregnant or trying to get pregnant -breast-feeding How should I use this medicine? This medicine is for infusion into a vein. It is given by a health care professional in a hospital or clinic setting. Talk to your pediatrician regarding the use of this medicine in children. While this drug may be prescribed for children as young as 1 month for selected conditions, precautions do apply. Overdosage: If you think you have taken too much of this medicine contact a poison control center or emergency room at once. NOTE: This medicine is only for you. Do not share this medicine with others. What if I miss a dose? This does not apply. What may interact with this medicine? -certain medicines for depression, anxiety, or psychotic disturbances -fentanyl -linezolid -MAOIs like Carbex, Eldepryl, Marplan, Nardil, and Parnate -methylene blue (injected into a vein) -tramadol This list may not describe all possible interactions. Give your health care provider a list of all the medicines, herbs, non-prescription drugs, or dietary supplements you use. Also tell them if you smoke, drink alcohol, or use illegal drugs. Some items may interact with your medicine. What should I watch for while using this medicine? Your condition will be monitored carefully while you are receiving this medicine. What side effects may I notice from receiving this medicine? Side effects that you should report to your doctor or health care professional as soon as possible: -allergic reactions like skin rash, itching or hives, swelling of the face, lips, or tongue -breathing problems -confusion -dizziness -fast, irregular heartbeat -fever and chills -loss of balance or coordination -seizures -sweating -swelling of the hands and feet -tremors -unusually weak or tired Side effects that usually do not require medical attention  (report to your doctor or health care professional if they continue or are bothersome): -constipation or diarrhea -headache This list may not describe all possible side effects. Call your doctor for medical advice about side effects. You may report side effects to FDA at 1-800-FDA-1088. Where should I keep my medicine? This drug is given in a hospital or clinic and will not be stored at home. NOTE: This sheet is a summary. It may not cover all possible information. If you have questions about this medicine, talk to your doctor, pharmacist, or health care provider.  2015, [employer]/Gold Standard. (2012-11-03 10:38:36)  Fosaprepitant injection What is this medicine? FOSAPREPITANT (fos ap RE pi tant) is used together with other medicines to prevent nausea and vomiting caused by cancer treatment (chemotherapy). This medicine may be used for other purposes; ask your health care provider or pharmacist if you have questions. COMMON BRAND NAME(S): Emend What should I tell my health care provider before I take this medicine? They need to know if you have any of these conditions: -liver disease -an unusual or allergic reaction to fosaprepitant, aprepitant, medicines, foods, dyes, or preservatives -pregnant or trying to get pregnant -breast-feeding How should I use this medicine? This medicine is for injection into a vein. It is given by a health care professional in a  hospital or clinic setting. Talk to your pediatrician regarding the use of this medicine in children. Special care may be needed. Overdosage: If you think you have taken too much of this medicine contact a poison control center or emergency room at once. NOTE: This medicine is only for you. Do not share this medicine with others. What if I miss a dose? This does not apply. What may interact with this medicine? Do not take this medicine with any of these medicines: -cisapride -pimozide -ranolazine This medicine may also interact  with the following medications: -diltiazem -male hormones, like estrogens or progestins and birth control pills -medicines for fungal infections like ketoconazole and itraconazole -medicines for HIV -medicines for seizures or to control epilepsy like carbamazepine or phenytoin -medicines used for sleep or anxiety disorders like alprazolam, diazepam, or midazolam -nefazodone -paroxetine -rifampin -some chemotherapy medications like etoposide, ifosfamide, vinblastine, vincristine -some antibiotics like clarithromycin, erythromycin, troleandomycin -steroid medicines like dexamethasone or methylprednisolone -tolbutamide -warfarin This list may not describe all possible interactions. Give your health care provider a list of all the medicines, herbs, non-prescription drugs, or dietary supplements you use. Also tell them if you smoke, drink alcohol, or use illegal drugs. Some items may interact with your medicine. What should I watch for while using this medicine? Do not take this medicine if you already have nausea and vomiting. Ask your health care provider what to do if you already have nausea. Birth control pills may not work properly while you are taking this medicine. Talk to your doctor about using an extra method of birth control. This medicine should not be used continuously for a long time. Visit your doctor or health care professional for regular check-ups. This medicine may change your liver function blood test results. What side effects may I notice from receiving this medicine? Side effects that you should report to your doctor or health care professional as soon as possible: -allergic reactions like skin rash, itching or hives, swelling of the face, lips, or tongue -breathing problems -changes in heart rhythm -high or low blood pressure -pain, redness, or irritation at site where injected -rectal bleeding -serious dizziness or disorientation, confusion -sharp or severe stomach  pain -sharp pain in your leg Side effects that usually do not require medical attention (report to your doctor or health care professional if they continue or are bothersome): -constipation or diarrhea -hair loss -headache -hiccups -loss of appetite -nausea -upset stomach -tiredness This list may not describe all possible side effects. Call your doctor for medical advice about side effects. You may report side effects to FDA at 1-800-FDA-1088. Where should I keep my medicine? This drug is given in a hospital or clinic and will not be stored at home. NOTE: This sheet is a summary. It may not cover all possible information. If you have questions about this medicine, talk to your doctor, pharmacist, or health care provider.  2015, [employer]/Gold Standard. (2008-12-10 12:46:13)

## 2014-06-25 ENCOUNTER — Ambulatory Visit (HOSPITAL_BASED_OUTPATIENT_CLINIC_OR_DEPARTMENT_OTHER): Payer: BLUE CROSS/BLUE SHIELD

## 2014-06-25 ENCOUNTER — Telehealth: Payer: Self-pay | Admitting: Hematology & Oncology

## 2014-06-25 VITALS — BP 128/69 | HR 87 | Temp 98.2°F | Resp 18

## 2014-06-25 DIAGNOSIS — C6291 Malignant neoplasm of right testis, unspecified whether descended or undescended: Secondary | ICD-10-CM

## 2014-06-25 DIAGNOSIS — Z5111 Encounter for antineoplastic chemotherapy: Secondary | ICD-10-CM

## 2014-06-25 MED ORDER — SODIUM CHLORIDE 0.9 % IV SOLN
30.0000 [IU] | Freq: Once | INTRAVENOUS | Status: AC
  Administered 2014-06-25: 30 [IU] via INTRAVENOUS
  Filled 2014-06-25: qty 10

## 2014-06-25 MED ORDER — POTASSIUM CHLORIDE 2 MEQ/ML IV SOLN
Freq: Once | INTRAVENOUS | Status: AC
  Administered 2014-06-25: 09:00:00 via INTRAVENOUS
  Filled 2014-06-25: qty 10

## 2014-06-25 MED ORDER — SODIUM CHLORIDE 0.9 % IV SOLN
20.0000 mg/m2 | Freq: Once | INTRAVENOUS | Status: AC
  Administered 2014-06-25: 43 mg via INTRAVENOUS
  Filled 2014-06-25: qty 43

## 2014-06-25 MED ORDER — ETOPOSIDE CHEMO INJECTION 1 GM/50ML
100.0000 mg/m2 | Freq: Once | INTRAVENOUS | Status: AC
  Administered 2014-06-25: 210 mg via INTRAVENOUS
  Filled 2014-06-25: qty 10.5

## 2014-06-25 MED ORDER — SODIUM CHLORIDE 0.9 % IV SOLN
20.0000 mg | Freq: Once | INTRAVENOUS | Status: AC
  Administered 2014-06-25: 20 mg via INTRAVENOUS
  Filled 2014-06-25: qty 2

## 2014-06-25 NOTE — Telephone Encounter (Signed)
[facility]  AUTH: [id number] VALID: 06/25/2014 - 06/25/2015  And  [id number] PALONOSETRON INJ APPROVED  AUTH: [id number] VALID: 06/25/2014 - 06/25/2015   Note: Dos: 06/24/2014 may not be covered. Sent to provider appeals today for updates and review.                 NPR for CHEMO below:  J3480 potassium chloride 2 mEq/ml Soln 20 mL Vial        J3475 magnesium sulfate 50 % Soln 2 mL Vial        J7042 dextrose 5 % and 0.45% NaCl Soln 1,000 mL Flex Cont        J7050 sodium chloride 0.9 % Soln                          J1100 dexamethasone 10 MG/ML Soln 1 mL Vial        J3490 sodium chloride 0.9 % Soln 100 mL Flex Cont        J9181 etoposide 1 GM/50ML Soln 25 mL Vial        J3490 sodium chloride 0.9 % Soln 50 mL Flex Cont 06/24/2014 Volanda Napoleon, MD  12    [phone] CISplatin 100 MG/100ML Soln 100 mL Vial 06/24/2014 Volanda Napoleon, MD  5    [phone] sodium chloride 0.9 % Soln 50 mL Flex Cont

## 2014-06-25 NOTE — Patient Instructions (Addendum)
[facility] Discharge Instructions for Patients Receiving Chemotherapy  Today you received the following chemotherapy agents Cisplatin, Bleomycin, Etoposide.  To help prevent nausea and vomiting after your treatment, we encourage you to take your nausea medication as prescribed.    If you develop nausea and vomiting that is not controlled by your nausea medication, call the clinic.   BELOW ARE SYMPTOMS THAT SHOULD BE REPORTED IMMEDIATELY:  *FEVER GREATER THAN 100.5 F  *CHILLS WITH OR WITHOUT FEVER  NAUSEA AND VOMITING THAT IS NOT CONTROLLED WITH YOUR NAUSEA MEDICATION  *UNUSUAL SHORTNESS OF BREATH  *UNUSUAL BRUISING OR BLEEDING  TENDERNESS IN MOUTH AND THROAT WITH OR WITHOUT PRESENCE OF ULCERS  *URINARY PROBLEMS  *BOWEL PROBLEMS  UNUSUAL RASH Items with * indicate a potential emergency and should be followed up as soon as possible.  Feel free to call the clinic you have any questions or concerns. The clinic phone number is [phone].  Please show the [facility] at check-in to the Emergency Department and triage nurse.  Bleomycin injection What is this medicine? BLEOMYCIN (blee oh MYE sin) is a chemotherapy drug. It is used to treat many kinds of cancer like lymphoma, cervical cancer, head and neck cancer, and testicular cancer. It is also used to prevent and to treat fluid build-up around the lungs caused by some cancers. This medicine may be used for other purposes; ask your health care provider or pharmacist if you have questions. COMMON BRAND NAME(S): Blenoxane What should I tell my health care provider before I take this medicine? They need to know if you have any of these conditions: -cigarette smoker -kidney disease -lung disease -recent or ongoing radiation therapy -an unusual or allergic reaction to bleomycin, other chemotherapy agents, other medicines, foods, dyes, or preservatives -pregnant or trying to get pregnant -breast-feeding How  should I use this medicine? This drug is given as an infusion into a vein or a body cavity. It can also be given as an injection into a muscle or under the skin. It is administered in a hospital or clinic by a specially trained health care professional. Talk to your pediatrician regarding the use of this medicine in children. Special care may be needed. Overdosage: If you think you have taken too much of this medicine contact a poison control center or emergency room at once. NOTE: This medicine is only for you. Do not share this medicine with others. What if I miss a dose? It is important not to miss your dose. Call your doctor or health care professional if you are unable to keep an appointment. What may interact with this medicine? -certain antibiotics given by injection -cisplatin -cyclosporine -diuretics -foscarnet -medicines to increase blood counts like filgrastim, pegfilgrastim, sargramostim -vaccines This list may not describe all possible interactions. Give your health care provider a list of all the medicines, herbs, non-prescription drugs, or dietary supplements you use. Also tell them if you smoke, drink alcohol, or use illegal drugs. Some items may interact with your medicine. What should I watch for while using this medicine? Visit your doctor for checks on your progress. This drug may make you feel generally unwell. This is not uncommon, as chemotherapy can affect healthy cells as well as cancer cells. Report any side effects. Continue your course of treatment even though you feel ill unless your doctor tells you to stop. Call your doctor or health care professional for advice if you get a fever, chills or sore throat, or other symptoms of  a cold or flu. Do not treat yourself. This drug decreases your body's ability to fight infections. Try to avoid being around people who are sick. Avoid taking products that contain aspirin, acetaminophen, ibuprofen, naproxen, or ketoprofen unless  instructed by your doctor. These medicines may hide a fever. Do not become pregnant while taking this medicine. Women should inform their doctor if they wish to become pregnant or think they might be pregnant. There is a potential for serious side effects to an unborn child. Talk to your health care professional or pharmacist for more information. Do not breast-feed an infant while taking this medicine. There is a maximum amount of this medicine you should receive throughout your life. The amount depends on the medical condition being treated and your overall health. Your doctor will watch how much of this medicine you receive in your lifetime. Tell your doctor if you have taken this medicine before. What side effects may I notice from receiving this medicine? Side effects that you should report to your doctor or health care professional as soon as possible: -allergic reactions like skin rash, itching or hives, swelling of the face, lips, or tongue -breathing problems -chest pain -confusion -cough -fast, irregular heartbeat -feeling faint or lightheaded, falls -fever or chills -mouth sores -pain, tingling, numbness in the hands or feet -trouble passing urine or change in the amount of urine -yellowing of the eyes or skin Side effects that usually do not require medical attention (report to your doctor or health care professional if they continue or are bothersome): -darker skin color -hair loss -irritation at site where injected -loss of appetite -nail changes -nausea and vomiting -weight loss This list may not describe all possible side effects. Call your doctor for medical advice about side effects. You may report side effects to FDA at 1-800-FDA-1088. Where should I keep my medicine? This drug is given in a hospital or clinic and will not be stored at home. NOTE: This sheet is a summary. It may not cover all possible information. If you have questions about this medicine, talk to your  doctor, pharmacist, or health care provider.  2015, [employer]vier/Gold Standard. (2012-04-25 09:36:48)

## 2014-06-26 ENCOUNTER — Ambulatory Visit (HOSPITAL_BASED_OUTPATIENT_CLINIC_OR_DEPARTMENT_OTHER): Payer: BLUE CROSS/BLUE SHIELD

## 2014-06-26 VITALS — BP 138/65 | HR 58 | Temp 97.5°F | Resp 20 | Wt 205.0 lb

## 2014-06-26 DIAGNOSIS — C6291 Malignant neoplasm of right testis, unspecified whether descended or undescended: Secondary | ICD-10-CM | POA: Diagnosis not present

## 2014-06-26 DIAGNOSIS — Z5111 Encounter for antineoplastic chemotherapy: Secondary | ICD-10-CM

## 2014-06-26 MED ORDER — SODIUM CHLORIDE 0.9 % IV SOLN
20.0000 mg/m2 | Freq: Once | INTRAVENOUS | Status: AC
  Administered 2014-06-26: 43 mg via INTRAVENOUS
  Filled 2014-06-26: qty 43

## 2014-06-26 MED ORDER — POTASSIUM CHLORIDE 2 MEQ/ML IV SOLN
Freq: Once | INTRAVENOUS | Status: AC
  Administered 2014-06-26: 09:00:00 via INTRAVENOUS
  Filled 2014-06-26: qty 10

## 2014-06-26 MED ORDER — SODIUM CHLORIDE 0.9 % IV SOLN
100.0000 mg/m2 | Freq: Once | INTRAVENOUS | Status: AC
  Administered 2014-06-26: 210 mg via INTRAVENOUS
  Filled 2014-06-26: qty 10.5

## 2014-06-26 MED ORDER — PALONOSETRON HCL INJECTION 0.25 MG/5ML
0.2500 mg | Freq: Once | INTRAVENOUS | Status: AC
  Administered 2014-06-26: 0.25 mg via INTRAVENOUS

## 2014-06-26 MED ORDER — PALONOSETRON HCL INJECTION 0.25 MG/5ML
INTRAVENOUS | Status: AC
  Filled 2014-06-26: qty 5

## 2014-06-26 MED ORDER — SODIUM CHLORIDE 0.9 % IV SOLN
Freq: Once | INTRAVENOUS | Status: AC
  Administered 2014-06-26: 09:00:00 via INTRAVENOUS

## 2014-06-26 MED ORDER — SODIUM CHLORIDE 0.9 % IV SOLN
20.0000 mg | Freq: Once | INTRAVENOUS | Status: AC
  Administered 2014-06-26: 20 mg via INTRAVENOUS
  Filled 2014-06-26: qty 2

## 2014-06-26 NOTE — Patient Instructions (Signed)
[facility] Discharge Instructions for Patients Receiving Chemotherapy  Today you received the following chemotherapy agents:  Etoposide and Cisplatin.  To help prevent nausea and vomiting after your treatment, we encourage you to take your nausea medications as directed.   If you develop nausea and vomiting that is not controlled by your nausea medication, call the clinic.   BELOW ARE SYMPTOMS THAT SHOULD BE REPORTED IMMEDIATELY:  *FEVER GREATER THAN 100.5 F  *CHILLS WITH OR WITHOUT FEVER  NAUSEA AND VOMITING THAT IS NOT CONTROLLED WITH YOUR NAUSEA MEDICATION  *UNUSUAL SHORTNESS OF BREATH  *UNUSUAL BRUISING OR BLEEDING  TENDERNESS IN MOUTH AND THROAT WITH OR WITHOUT PRESENCE OF ULCERS  *URINARY PROBLEMS  *BOWEL PROBLEMS  UNUSUAL RASH Items with * indicate a potential emergency and should be followed up as soon as possible.  Feel free to call the clinic you have any questions or concerns. The clinic phone number is [phone].  Please show the [facility] at check-in to the Emergency Department and triage nurse.

## 2014-06-27 ENCOUNTER — Ambulatory Visit (HOSPITAL_BASED_OUTPATIENT_CLINIC_OR_DEPARTMENT_OTHER): Payer: BLUE CROSS/BLUE SHIELD

## 2014-06-27 VITALS — BP 125/69 | HR 55 | Temp 97.8°F | Resp 18

## 2014-06-27 DIAGNOSIS — C6291 Malignant neoplasm of right testis, unspecified whether descended or undescended: Secondary | ICD-10-CM

## 2014-06-27 DIAGNOSIS — Z5111 Encounter for antineoplastic chemotherapy: Secondary | ICD-10-CM

## 2014-06-27 DIAGNOSIS — C779 Secondary and unspecified malignant neoplasm of lymph node, unspecified: Secondary | ICD-10-CM

## 2014-06-27 MED ORDER — SODIUM CHLORIDE 0.9 % IV SOLN
20.0000 mg | Freq: Once | INTRAVENOUS | Status: AC
  Administered 2014-06-27: 20 mg via INTRAVENOUS
  Filled 2014-06-27: qty 2

## 2014-06-27 MED ORDER — SODIUM CHLORIDE 0.9 % IV SOLN
Freq: Once | INTRAVENOUS | Status: AC
  Administered 2014-06-27: 09:00:00 via INTRAVENOUS

## 2014-06-27 MED ORDER — POTASSIUM CHLORIDE 2 MEQ/ML IV SOLN
Freq: Once | INTRAVENOUS | Status: AC
  Administered 2014-06-27: 09:00:00 via INTRAVENOUS
  Filled 2014-06-27: qty 10

## 2014-06-27 MED ORDER — SODIUM CHLORIDE 0.9 % IV SOLN
20.0000 mg/m2 | Freq: Once | INTRAVENOUS | Status: AC
  Administered 2014-06-27: 43 mg via INTRAVENOUS
  Filled 2014-06-27: qty 43

## 2014-06-27 MED ORDER — SODIUM CHLORIDE 0.9 % IV SOLN
100.0000 mg/m2 | Freq: Once | INTRAVENOUS | Status: AC
  Administered 2014-06-27: 210 mg via INTRAVENOUS
  Filled 2014-06-27: qty 10.5

## 2014-06-27 NOTE — Patient Instructions (Signed)
[facility] Discharge Instructions for Patients Receiving Chemotherapy  Today you received the following chemotherapy agents Cisplatin and Etoposide.  To help prevent nausea and vomiting after your treatment, we encourage you to take your nausea medication.   If you develop nausea and vomiting that is not controlled by your nausea medication, call the clinic.   BELOW ARE SYMPTOMS THAT SHOULD BE REPORTED IMMEDIATELY:  *FEVER GREATER THAN 100.5 F  *CHILLS WITH OR WITHOUT FEVER  NAUSEA AND VOMITING THAT IS NOT CONTROLLED WITH YOUR NAUSEA MEDICATION  *UNUSUAL SHORTNESS OF BREATH  *UNUSUAL BRUISING OR BLEEDING  TENDERNESS IN MOUTH AND THROAT WITH OR WITHOUT PRESENCE OF ULCERS  *URINARY PROBLEMS  *BOWEL PROBLEMS  UNUSUAL RASH Items with * indicate a potential emergency and should be followed up as soon as possible.  Feel free to call the clinic you have any questions or concerns. The clinic phone number is [phone].  Please show the CHEMO ALERT CARD at check-in to the Emergency Department and triage nurse.   

## 2014-06-28 ENCOUNTER — Ambulatory Visit (HOSPITAL_BASED_OUTPATIENT_CLINIC_OR_DEPARTMENT_OTHER): Payer: BLUE CROSS/BLUE SHIELD

## 2014-06-28 VITALS — BP 130/72 | HR 65 | Temp 97.8°F | Resp 18

## 2014-06-28 DIAGNOSIS — Z5111 Encounter for antineoplastic chemotherapy: Secondary | ICD-10-CM | POA: Diagnosis not present

## 2014-06-28 DIAGNOSIS — C6291 Malignant neoplasm of right testis, unspecified whether descended or undescended: Secondary | ICD-10-CM

## 2014-06-28 MED ORDER — PALONOSETRON HCL INJECTION 0.25 MG/5ML
0.2500 mg | Freq: Once | INTRAVENOUS | Status: AC
  Administered 2014-06-28: 0.25 mg via INTRAVENOUS

## 2014-06-28 MED ORDER — SODIUM CHLORIDE 0.9 % IV SOLN
Freq: Once | INTRAVENOUS | Status: AC
  Administered 2014-06-28: 09:00:00 via INTRAVENOUS

## 2014-06-28 MED ORDER — PALONOSETRON HCL INJECTION 0.25 MG/5ML
INTRAVENOUS | Status: AC
  Filled 2014-06-28: qty 5

## 2014-06-28 MED ORDER — SODIUM CHLORIDE 0.9 % IV SOLN
20.0000 mg | Freq: Once | INTRAVENOUS | Status: AC
  Administered 2014-06-28: 20 mg via INTRAVENOUS
  Filled 2014-06-28: qty 2

## 2014-06-28 MED ORDER — SODIUM CHLORIDE 0.9 % IV SOLN
20.0000 mg/m2 | Freq: Once | INTRAVENOUS | Status: AC
  Administered 2014-06-28: 43 mg via INTRAVENOUS
  Filled 2014-06-28: qty 43

## 2014-06-28 MED ORDER — POTASSIUM CHLORIDE 2 MEQ/ML IV SOLN
Freq: Once | INTRAVENOUS | Status: AC
  Administered 2014-06-28: 09:00:00 via INTRAVENOUS
  Filled 2014-06-28: qty 10

## 2014-06-28 MED ORDER — SODIUM CHLORIDE 0.9 % IV SOLN
100.0000 mg/m2 | Freq: Once | INTRAVENOUS | Status: AC
  Administered 2014-06-28: 210 mg via INTRAVENOUS
  Filled 2014-06-28: qty 10.5

## 2014-06-28 NOTE — Patient Instructions (Signed)
[facility] Discharge Instructions for Patients Receiving Chemotherapy  Today you received the following chemotherapy agents Cisplatin, Etoposide.  To help prevent nausea and vomiting after your treatment, we encourage you to take your nausea medication.   If you develop nausea and vomiting that is not controlled by your nausea medication, call the clinic.   BELOW ARE SYMPTOMS THAT SHOULD BE REPORTED IMMEDIATELY:  *FEVER GREATER THAN 100.5 F  *CHILLS WITH OR WITHOUT FEVER  NAUSEA AND VOMITING THAT IS NOT CONTROLLED WITH YOUR NAUSEA MEDICATION  *UNUSUAL SHORTNESS OF BREATH  *UNUSUAL BRUISING OR BLEEDING  TENDERNESS IN MOUTH AND THROAT WITH OR WITHOUT PRESENCE OF ULCERS  *URINARY PROBLEMS  *BOWEL PROBLEMS  UNUSUAL RASH Items with * indicate a potential emergency and should be followed up as soon as possible.  Feel free to call the clinic you have any questions or concerns. The clinic phone number is [phone].  Please show the [facility] at check-in to the Emergency Department and triage nurse.

## 2014-07-02 ENCOUNTER — Ambulatory Visit (HOSPITAL_BASED_OUTPATIENT_CLINIC_OR_DEPARTMENT_OTHER): Payer: BLUE CROSS/BLUE SHIELD

## 2014-07-02 VITALS — BP 128/71 | HR 73 | Temp 97.9°F | Resp 18

## 2014-07-02 DIAGNOSIS — Z5111 Encounter for antineoplastic chemotherapy: Secondary | ICD-10-CM | POA: Diagnosis not present

## 2014-07-02 DIAGNOSIS — C6291 Malignant neoplasm of right testis, unspecified whether descended or undescended: Secondary | ICD-10-CM | POA: Diagnosis not present

## 2014-07-02 MED ORDER — SODIUM CHLORIDE 0.9 % IJ SOLN
10.0000 mL | INTRAMUSCULAR | Status: DC | PRN
  Filled 2014-07-02: qty 10

## 2014-07-02 MED ORDER — SODIUM CHLORIDE 0.9 % IV SOLN
30.0000 [IU] | Freq: Once | INTRAVENOUS | Status: AC
  Administered 2014-07-02: 30 [IU] via INTRAVENOUS
  Filled 2014-07-02: qty 10

## 2014-07-02 MED ORDER — PROCHLORPERAZINE MALEATE 10 MG PO TABS
10.0000 mg | ORAL_TABLET | Freq: Once | ORAL | Status: AC
  Administered 2014-07-02: 10 mg via ORAL

## 2014-07-02 MED ORDER — SODIUM CHLORIDE 0.9 % IV SOLN
Freq: Once | INTRAVENOUS | Status: AC
  Administered 2014-07-02: 09:00:00 via INTRAVENOUS

## 2014-07-02 MED ORDER — HEPARIN SOD (PORK) LOCK FLUSH 100 UNIT/ML IV SOLN
500.0000 [IU] | Freq: Once | INTRAVENOUS | Status: DC | PRN
  Filled 2014-07-02: qty 5

## 2014-07-02 MED ORDER — PROCHLORPERAZINE MALEATE 10 MG PO TABS
ORAL_TABLET | ORAL | Status: AC
  Filled 2014-07-02: qty 1

## 2014-07-02 NOTE — Patient Instructions (Signed)
Bleomycin injection What is this medicine? BLEOMYCIN (blee oh MYE sin) is a chemotherapy drug. It is used to treat many kinds of cancer like lymphoma, cervical cancer, head and neck cancer, and testicular cancer. It is also used to prevent and to treat fluid build-up around the lungs caused by some cancers. This medicine may be used for other purposes; ask your health care provider or pharmacist if you have questions. COMMON BRAND NAME(S): Blenoxane What should I tell my health care provider before I take this medicine? They need to know if you have any of these conditions: -cigarette smoker -kidney disease -lung disease -recent or ongoing radiation therapy -an unusual or allergic reaction to bleomycin, other chemotherapy agents, other medicines, foods, dyes, or preservatives -pregnant or trying to get pregnant -breast-feeding How should I use this medicine? This drug is given as an infusion into a vein or a body cavity. It can also be given as an injection into a muscle or under the skin. It is administered in a hospital or clinic by a specially trained health care professional. Talk to your pediatrician regarding the use of this medicine in children. Special care may be needed. Overdosage: If you think you have taken too much of this medicine contact a poison control center or emergency room at once. NOTE: This medicine is only for you. Do not share this medicine with others. What if I miss a dose? It is important not to miss your dose. Call your doctor or health care professional if you are unable to keep an appointment. What may interact with this medicine? -certain antibiotics given by injection -cisplatin -cyclosporine -diuretics -foscarnet -medicines to increase blood counts like filgrastim, pegfilgrastim, sargramostim -vaccines This list may not describe all possible interactions. Give your health care provider a list of all the medicines, herbs, non-prescription drugs, or  dietary supplements you use. Also tell them if you smoke, drink alcohol, or use illegal drugs. Some items may interact with your medicine. What should I watch for while using this medicine? Visit your doctor for checks on your progress. This drug may make you feel generally unwell. This is not uncommon, as chemotherapy can affect healthy cells as well as cancer cells. Report any side effects. Continue your course of treatment even though you feel ill unless your doctor tells you to stop. Call your doctor or health care professional for advice if you get a fever, chills or sore throat, or other symptoms of a cold or flu. Do not treat yourself. This drug decreases your body's ability to fight infections. Try to avoid being around people who are sick. Avoid taking products that contain aspirin, acetaminophen, ibuprofen, naproxen, or ketoprofen unless instructed by your doctor. These medicines may hide a fever. Do not become pregnant while taking this medicine. Women should inform their doctor if they wish to become pregnant or think they might be pregnant. There is a potential for serious side effects to an unborn child. Talk to your health care professional or pharmacist for more information. Do not breast-feed an infant while taking this medicine. There is a maximum amount of this medicine you should receive throughout your life. The amount depends on the medical condition being treated and your overall health. Your doctor will watch how much of this medicine you receive in your lifetime. Tell your doctor if you have taken this medicine before. What side effects may I notice from receiving this medicine? Side effects that you should report to your doctor or health care professional  as soon as possible: -allergic reactions like skin rash, itching or hives, swelling of the face, lips, or tongue -breathing problems -chest pain -confusion -cough -fast, irregular heartbeat -feeling faint or lightheaded,  falls -fever or chills -mouth sores -pain, tingling, numbness in the hands or feet -trouble passing urine or change in the amount of urine -yellowing of the eyes or skin Side effects that usually do not require medical attention (report to your doctor or health care professional if they continue or are bothersome): -darker skin color -hair loss -irritation at site where injected -loss of appetite -nail changes -nausea and vomiting -weight loss This list may not describe all possible side effects. Call your doctor for medical advice about side effects. You may report side effects to FDA at 1-800-FDA-1088. Where should I keep my medicine? This drug is given in a hospital or clinic and will not be stored at home. NOTE: This sheet is a summary. It may not cover all possible information. If you have questions about this medicine, talk to your doctor, pharmacist, or health care provider.  2015, Elsevier/Gold Standard. (2012-04-25 09:36:48)

## 2014-07-04 ENCOUNTER — Other Ambulatory Visit: Payer: Self-pay | Admitting: Family

## 2014-07-04 ENCOUNTER — Encounter: Payer: Self-pay | Admitting: Family

## 2014-07-04 ENCOUNTER — Ambulatory Visit (HOSPITAL_BASED_OUTPATIENT_CLINIC_OR_DEPARTMENT_OTHER): Payer: BLUE CROSS/BLUE SHIELD | Admitting: Family

## 2014-07-04 ENCOUNTER — Telehealth: Payer: Self-pay | Admitting: *Deleted

## 2014-07-04 ENCOUNTER — Ambulatory Visit (HOSPITAL_BASED_OUTPATIENT_CLINIC_OR_DEPARTMENT_OTHER)
Admission: RE | Admit: 2014-07-04 | Discharge: 2014-07-04 | Disposition: A | Discharge status: Home / Self Care | Payer: BLUE CROSS/BLUE SHIELD | Source: Ambulatory Visit | Attending: Family | Admitting: Family

## 2014-07-04 ENCOUNTER — Other Ambulatory Visit (HOSPITAL_BASED_OUTPATIENT_CLINIC_OR_DEPARTMENT_OTHER): Payer: BLUE CROSS/BLUE SHIELD

## 2014-07-04 VITALS — BP 131/80 | HR 87 | Temp 98.0°F | Resp 16 | Ht 70.0 in | Wt 206.0 lb

## 2014-07-04 DIAGNOSIS — R21 Rash and other nonspecific skin eruption: Secondary | ICD-10-CM

## 2014-07-04 DIAGNOSIS — Z8547 Personal history of malignant neoplasm of testis: Secondary | ICD-10-CM | POA: Insufficient documentation

## 2014-07-04 DIAGNOSIS — C6291 Malignant neoplasm of right testis, unspecified whether descended or undescended: Secondary | ICD-10-CM | POA: Diagnosis not present

## 2014-07-04 DIAGNOSIS — R05 Cough: Secondary | ICD-10-CM | POA: Diagnosis present

## 2014-07-04 LAB — CMP (CANCER CENTER ONLY)
ALT: 135 U/L — AB (ref 10–47)
AST: 44 U/L — ABNORMAL HIGH (ref 11–38)
Albumin: 3.7 g/dL (ref 3.3–5.5)
Alkaline Phosphatase: 51 U/L (ref 26–84)
BILIRUBIN TOTAL: 0.8 mg/dL (ref 0.20–1.60)
BUN: 22 mg/dL (ref 7–22)
CALCIUM: 9 mg/dL (ref 8.0–10.3)
CHLORIDE: 96 meq/L — AB (ref 98–108)
CO2: 27 mEq/L (ref 18–33)
CREATININE: 1.3 mg/dL — AB (ref 0.6–1.2)
Glucose, Bld: 119 mg/dL — ABNORMAL HIGH (ref 73–118)
Potassium: 3.8 mEq/L (ref 3.3–4.7)
Sodium: 137 mEq/L (ref 128–145)
Total Protein: 6.6 g/dL (ref 6.4–8.1)

## 2014-07-04 LAB — CBC WITH DIFFERENTIAL (CANCER CENTER ONLY)
BASO#: 0 10*3/uL (ref 0.0–0.2)
BASO%: 0.2 % (ref 0.0–2.0)
EOS ABS: 0 10*3/uL (ref 0.0–0.5)
EOS%: 0.5 % (ref 0.0–7.0)
HCT: 39 % (ref 38.7–49.9)
HGB: 13.6 g/dL (ref 13.0–17.1)
LYMPH#: 1.1 10*3/uL (ref 0.9–3.3)
LYMPH%: 25.7 % (ref 14.0–48.0)
MCH: 32.9 pg (ref 28.0–33.4)
MCHC: 34.9 g/dL (ref 32.0–35.9)
MCV: 94 fL (ref 82–98)
MONO#: 0.2 10*3/uL (ref 0.1–0.9)
MONO%: 3.9 % (ref 0.0–13.0)
NEUT#: 2.9 10*3/uL (ref 1.5–6.5)
NEUT%: 69.7 % (ref 40.0–80.0)
PLATELETS: 133 10*3/uL — AB (ref 145–400)
RBC: 4.13 10*6/uL — AB (ref 4.20–5.70)
RDW: 11.4 % (ref 11.1–15.7)
WBC: 4.1 10*3/uL (ref 4.0–10.0)

## 2014-07-04 LAB — PROTIME-INR (CHCC SATELLITE)
INR: 0.9 — AB (ref 2.0–3.5)
Protime: 10.8 Seconds (ref 10.6–13.4)

## 2014-07-04 NOTE — Progress Notes (Signed)
Hematology and Oncology Follow Up Visit  Angello Chien [id number] Mar 30, 1970 44 y.o. 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 Buys is here today with c/o redness to the inner portion or his left arm that he noticed this morning. The area is not painful or itchy. There is no edema. His radial pulse is +2. There is a high occurrence of rashes and erythema in patient's receiving Bleocin. He has had  He has a dry cough. We did a chest xray which showed possible bronchitis but no pneumonia. He states that the cough is not an issue and he does not need cough syrup.  No fever, chills, n/v, SOB, palpitations, changes in bowel or bladder habits. No blood in urine or stool.  No changes in his appetite he is eating very well. He is staying hydrated drinking lot of water and Gatorade.  No swelling, tenderness, numbness or tingling in his extremities. No new aches or pains.  He is still remaining active playing ball.   Medications:    Medication List       This list is accurate as of: 07/04/14  4:05 PM.  Always use your most recent med list.               dexamethasone 4 MG tablet  Commonly known as:  DECADRON  Take 2 tablets by mouth once a day on the day after chemotherapy and then take 2 tablets two times a day for 2 days. Take with food.     HYDROcodone-acetaminophen 5-325 MG per tablet  Commonly known as:  NORCO/VICODIN  Take by mouth every 6 (six) hours as needed.     LORazepam 0.5 MG tablet  Commonly known as:  ATIVAN  Take 1 tablet (0.5 mg total) by mouth every 6 (six) hours as needed (Nausea or vomiting).     ondansetron 8 MG tablet  Commonly known as:  ZOFRAN  Take 1 tablet (8 mg total) by mouth 2 (two) times daily as needed. Start on the third day after chemotherapy.     prochlorperazine 10 MG tablet  Commonly known as:   COMPAZINE  Take 1 tablet (10 mg total) by mouth every 6 (six) hours as needed (Nausea or vomiting).        Allergies: No Known Allergies  Past Medical History, Surgical history, Social history, and Family History were reviewed and updated.  Review of Systems: All other 10 point review of systems is negative.   Physical Exam:  height is 5\' 10"  (1.778 m) and weight is 206 lb (93.441 kg). His oral temperature is 98 F (36.7 C). His blood pressure is 131/80 and his pulse is 87. His respiration is 16.   Wt Readings from Last 3 Encounters:  07/04/14 206 lb (93.441 kg)  06/26/14 205 lb (92.987 kg)  06/14/14 202 lb (91.627 kg)    Ocular: Sclerae unicteric, pupils equal, round and reactive to light Ear-nose-throat: Oropharynx clear, dentition fair Lymphatic: No cervical or supraclavicular adenopathy Lungs no rales or rhonchi, good excursion bilaterally Heart regular rate and rhythm, no murmur appreciated Abd soft, nontender, positive bowel sounds MSK no focal spinal tenderness, no joint edema Neuro: non-focal, well-oriented, appropriate affect Breasts: Deferred  Lab Results  Component Value Date   WBC 4.1 07/04/2014   HGB 13.6 07/04/2014   HCT 39.0 07/04/2014  MCV 94 07/04/2014   PLT 133* 07/04/2014   No results found for: FERRITIN, IRON, TIBC, UIBC, IRONPCTSAT Lab Results  Component Value Date   RBC 4.13* 07/04/2014   No results found for: KPAFRELGTCHN, LAMBDASER, KAPLAMBRATIO No results found for: IGGSERUM, IGA, IGMSERUM No results found for: Odetta Pink, SPEI   Chemistry      Component Value Date/Time   NA 137 07/04/2014 1303   NA 137 06/14/2014 1150   K 3.8 07/04/2014 1303   K 4.0 06/14/2014 1150   CL 96* 07/04/2014 1303   CL 102 06/14/2014 1150   CO2 27 07/04/2014 1303   CO2 27 06/14/2014 1150   BUN 22 07/04/2014 1303   BUN 21 06/14/2014 1150   CREATININE 1.3* 07/04/2014 1303   CREATININE 0.86  06/14/2014 1150      Component Value Date/Time   CALCIUM 9.0 07/04/2014 1303   CALCIUM 9.0 06/14/2014 1150   ALKPHOS 51 07/04/2014 1303   ALKPHOS 44 06/14/2014 1150   AST 44* 07/04/2014 1303   AST 18 06/14/2014 1150   ALT 135* 07/04/2014 1303   ALT 34 06/14/2014 1150   BILITOT 0.80 07/04/2014 1303   BILITOT 0.5 06/14/2014 1150     Impression and Plan: Mr. Platten is a 44 year old gentleman with a stage I non-seminomatous germ cell tumor. He is s/p orchiectomy by Dr. Risa Grill. He is still in cycle 1 of BEP. He has had 2 doses of Bleocin. He has a reddened area on the inner portion of his left upper arm. There is a high occurrence of rashes and erythema in patient receiving Bleocin. There is no edema, itching or pain to the area. His radial pulse is 2+. He will try using hydrocortisone cream this weekend and see if this helps.  He is back on Tuesday for treatment so we will take a look at again then to see if the cream is helping.  His chest x-ray was negative. He did not want any cough syrup and prefers to just "ride it out." He knows to call with any other questions or concerns and to go to the ED in the event of an emergency.   Eliezer Bottom, NP 6/23/20164:05 PM

## 2014-07-04 NOTE — Telephone Encounter (Signed)
Patient called c/o dry cough and a large bruise.  His last chemo was 6/21. He denies any other symptoms and is taking his medications as ordered.   Due to his chemo regimen including bleomycin and his respiratory symptoms, patient will be brought in for an x-ray and assessment by NP. Patient is in agreement with plan.

## 2014-07-09 ENCOUNTER — Emergency Department (HOSPITAL_BASED_OUTPATIENT_CLINIC_OR_DEPARTMENT_OTHER): Payer: BLUE CROSS/BLUE SHIELD

## 2014-07-09 ENCOUNTER — Encounter (HOSPITAL_BASED_OUTPATIENT_CLINIC_OR_DEPARTMENT_OTHER): Payer: Self-pay

## 2014-07-09 ENCOUNTER — Inpatient Hospital Stay (HOSPITAL_BASED_OUTPATIENT_CLINIC_OR_DEPARTMENT_OTHER)
Admission: EM | Admit: 2014-07-09 | Discharge: 2014-07-11 | DRG: 810 | Disposition: A | Discharge status: Home / Self Care | Payer: BLUE CROSS/BLUE SHIELD | Attending: Internal Medicine | Admitting: Internal Medicine

## 2014-07-09 ENCOUNTER — Ambulatory Visit (HOSPITAL_BASED_OUTPATIENT_CLINIC_OR_DEPARTMENT_OTHER): Payer: BLUE CROSS/BLUE SHIELD

## 2014-07-09 VITALS — BP 121/68 | HR 63 | Temp 97.6°F | Resp 16

## 2014-07-09 DIAGNOSIS — R0982 Postnasal drip: Secondary | ICD-10-CM | POA: Diagnosis present

## 2014-07-09 DIAGNOSIS — D709 Neutropenia, unspecified: Secondary | ICD-10-CM | POA: Diagnosis present

## 2014-07-09 DIAGNOSIS — C6291 Malignant neoplasm of right testis, unspecified whether descended or undescended: Secondary | ICD-10-CM | POA: Diagnosis present

## 2014-07-09 DIAGNOSIS — R05 Cough: Secondary | ICD-10-CM

## 2014-07-09 DIAGNOSIS — Z79899 Other long term (current) drug therapy: Secondary | ICD-10-CM | POA: Diagnosis not present

## 2014-07-09 DIAGNOSIS — Z9221 Personal history of antineoplastic chemotherapy: Secondary | ICD-10-CM | POA: Diagnosis not present

## 2014-07-09 DIAGNOSIS — R5081 Fever presenting with conditions classified elsewhere: Secondary | ICD-10-CM | POA: Diagnosis present

## 2014-07-09 DIAGNOSIS — Z5111 Encounter for antineoplastic chemotherapy: Secondary | ICD-10-CM | POA: Diagnosis not present

## 2014-07-09 DIAGNOSIS — R059 Cough, unspecified: Secondary | ICD-10-CM

## 2014-07-09 DIAGNOSIS — C6290 Malignant neoplasm of unspecified testis, unspecified whether descended or undescended: Secondary | ICD-10-CM | POA: Diagnosis present

## 2014-07-09 HISTORY — DX: Malignant neoplasm of unspecified testis, unspecified whether descended or undescended: C62.90

## 2014-07-09 LAB — URINALYSIS, ROUTINE W REFLEX MICROSCOPIC
Bilirubin Urine: NEGATIVE
GLUCOSE, UA: NEGATIVE mg/dL
Hgb urine dipstick: NEGATIVE
Ketones, ur: NEGATIVE mg/dL
LEUKOCYTES UA: NEGATIVE
Nitrite: NEGATIVE
PH: 7.5 (ref 5.0–8.0)
PROTEIN: NEGATIVE mg/dL
Specific Gravity, Urine: 1.011 (ref 1.005–1.030)
Urobilinogen, UA: 0.2 mg/dL (ref 0.0–1.0)

## 2014-07-09 LAB — BASIC METABOLIC PANEL
Anion gap: 8 (ref 5–15)
BUN: 16 mg/dL (ref 6–20)
CO2: 25 mmol/L (ref 22–32)
CREATININE: 0.91 mg/dL (ref 0.61–1.24)
Calcium: 8.6 mg/dL — ABNORMAL LOW (ref 8.9–10.3)
Chloride: 102 mmol/L (ref 101–111)
GFR calc Af Amer: >60 mL/min (ref >60)
Glucose, Bld: 113 mg/dL — ABNORMAL HIGH (ref 65–99)
POTASSIUM: 3.6 mmol/L (ref 3.5–5.1)
Sodium: 135 mmol/L (ref 135–145)

## 2014-07-09 LAB — CBC WITH DIFFERENTIAL/PLATELET
BASOS ABS: 0 10*3/uL (ref 0.0–0.1)
Band Neutrophils: 16 % — ABNORMAL HIGH (ref 0–10)
Basophils Relative: 0 % (ref 0–1)
EOS PCT: 0 % (ref 0–5)
Eosinophils Absolute: 0 10*3/uL (ref 0.0–0.7)
HCT: 38.6 % — ABNORMAL LOW (ref 39.0–52.0)
HEMOGLOBIN: 13 g/dL (ref 13.0–17.0)
Lymphocytes Relative: 26 % (ref 12–46)
Lymphs Abs: 0.2 10*3/uL — ABNORMAL LOW (ref 0.7–4.0)
MCH: 31.8 pg (ref 26.0–34.0)
MCHC: 33.7 g/dL (ref 30.0–36.0)
MCV: 94.4 fL (ref 78.0–100.0)
MONO ABS: 0.1 10*3/uL (ref 0.1–1.0)
MONOS PCT: 10 % (ref 3–12)
Neutro Abs: 0.4 10*3/uL — ABNORMAL LOW (ref 1.7–7.7)
Neutrophils Relative %: 48 % (ref 43–77)
Platelets: 141 10*3/uL — ABNORMAL LOW (ref 150–400)
RBC: 4.09 MIL/uL — ABNORMAL LOW (ref 4.22–5.81)
RDW: 12.1 % (ref 11.5–15.5)
WBC: 0.7 10*3/uL — AB (ref 4.0–10.5)

## 2014-07-09 LAB — RAPID STREP SCREEN (MED CTR MEBANE ONLY): Streptococcus, Group A Screen (Direct): NEGATIVE

## 2014-07-09 MED ORDER — SODIUM CHLORIDE 0.9 % IV SOLN
Freq: Once | INTRAVENOUS | Status: AC
  Administered 2014-07-09: 10:00:00 via INTRAVENOUS

## 2014-07-09 MED ORDER — VANCOMYCIN HCL IN DEXTROSE 1-5 GM/200ML-% IV SOLN
1000.0000 mg | Freq: Three times a day (TID) | INTRAVENOUS | Status: DC
  Administered 2014-07-09 – 2014-07-10 (×4): 1000 mg via INTRAVENOUS
  Filled 2014-07-09 (×5): qty 200

## 2014-07-09 MED ORDER — PIPERACILLIN-TAZOBACTAM 3.375 G IVPB
3.3750 g | Freq: Three times a day (TID) | INTRAVENOUS | Status: DC
  Administered 2014-07-09 – 2014-07-11 (×5): 3.375 g via INTRAVENOUS
  Filled 2014-07-09 (×5): qty 50

## 2014-07-09 MED ORDER — PROCHLORPERAZINE MALEATE 10 MG PO TABS
ORAL_TABLET | ORAL | Status: AC
  Filled 2014-07-09: qty 1

## 2014-07-09 MED ORDER — PROCHLORPERAZINE MALEATE 10 MG PO TABS
10.0000 mg | ORAL_TABLET | Freq: Once | ORAL | Status: AC
  Administered 2014-07-09: 10 mg via ORAL

## 2014-07-09 MED ORDER — SODIUM CHLORIDE 0.9 % IV BOLUS (SEPSIS)
1000.0000 mL | Freq: Once | INTRAVENOUS | Status: AC
  Administered 2014-07-09: 1000 mL via INTRAVENOUS

## 2014-07-09 MED ORDER — SODIUM CHLORIDE 0.9 % IV SOLN
30.0000 [IU] | Freq: Once | INTRAVENOUS | Status: AC
  Administered 2014-07-09: 30 [IU] via INTRAVENOUS
  Filled 2014-07-09: qty 10

## 2014-07-09 NOTE — ED Notes (Signed)
Pt is a/o x 3, having fever since about 1 today, took an Advil and has come down.  Pt finished his last round of chemo this morning around 9am and started feeling bad around 1.  Pt ws dx with the start of bronchitis.  PT still having bad cough and sore throat.

## 2014-07-09 NOTE — ED Notes (Addendum)
Patient here with fever that started this afternoon after chemo treatment this am for testicular cancer. Also reports that he developed headache and cough after treatment as well

## 2014-07-09 NOTE — Progress Notes (Signed)
ANTIBIOTIC CONSULT NOTE - INITIAL  Pharmacy Consult for Vancomycin and Zosyn Indication: rule out sepsis  No Known Allergies  Patient Measurements: Weight: 206 lb (93.441 kg)  Labs:  Recent Labs  07/09/14 1920  WBC 0.7*  HGB 13.0  PLT 141*  CREATININE 0.91   Estimated Creatinine Clearance: 122.7 mL/min (by C-G formula based on Cr of 0.91). No results for input(s): VANCOTROUGH, VANCOPEAK, VANCORANDOM, GENTTROUGH, GENTPEAK, GENTRANDOM, TOBRATROUGH, TOBRAPEAK, TOBRARND, AMIKACINPEAK, AMIKACINTROU, AMIKACIN in the last 72 hours.   Microbiology: Recent Results (from the past 720 hour(s))  Rapid strep screen     Status: None   Collection Time: 07/09/14  7:18 PM  Result Value Ref Range Status   Streptococcus, Group A Screen (Direct) NEGATIVE NEGATIVE Final    Comment: (NOTE) A Rapid Antigen test may result negative if the antigen level in the sample is below the detection level of this test. The FDA has not cleared this test as a stand-alone test therefore the rapid antigen negative result has reflexed to a Group A Strep culture.     Medical History: Past Medical History  Diagnosis Date  . Testicular mass     right  . Testicle cancer     Assessment: 44 year old male with testicular cancer undergoing chemo now starting broad spectrum antibiotics for febrile neutropenia  Goal of Therapy:  Vancomycin trough level 15-20 mcg/ml  Appropriate Zosyn dosing  Plan:  Vancomycin 1 gram iv Q 8 hours Zosyn 4 hr infusion Follow Scr, fever trend, cultures  Thank you Anette Guarneri, PharmD [phone]  07/09/2014,8:56 PM

## 2014-07-09 NOTE — Patient Instructions (Signed)
Bleomycin injection What is this medicine? BLEOMYCIN (blee oh MYE sin) is a chemotherapy drug. It is used to treat many kinds of cancer like lymphoma, cervical cancer, head and neck cancer, and testicular cancer. It is also used to prevent and to treat fluid build-up around the lungs caused by some cancers. This medicine may be used for other purposes; ask your health care provider or pharmacist if you have questions. COMMON BRAND NAME(S): Blenoxane What should I tell my health care provider before I take this medicine? They need to know if you have any of these conditions: -cigarette smoker -kidney disease -lung disease -recent or ongoing radiation therapy -an unusual or allergic reaction to bleomycin, other chemotherapy agents, other medicines, foods, dyes, or preservatives -pregnant or trying to get pregnant -breast-feeding How should I use this medicine? This drug is given as an infusion into a vein or a body cavity. It can also be given as an injection into a muscle or under the skin. It is administered in a hospital or clinic by a specially trained health care professional. Talk to your pediatrician regarding the use of this medicine in children. Special care may be needed. Overdosage: If you think you have taken too much of this medicine contact a poison control center or emergency room at once. NOTE: This medicine is only for you. Do not share this medicine with others. What if I miss a dose? It is important not to miss your dose. Call your doctor or health care professional if you are unable to keep an appointment. What may interact with this medicine? -certain antibiotics given by injection -cisplatin -cyclosporine -diuretics -foscarnet -medicines to increase blood counts like filgrastim, pegfilgrastim, sargramostim -vaccines This list may not describe all possible interactions. Give your health care provider a list of all the medicines, herbs, non-prescription drugs, or  dietary supplements you use. Also tell them if you smoke, drink alcohol, or use illegal drugs. Some items may interact with your medicine. What should I watch for while using this medicine? Visit your doctor for checks on your progress. This drug may make you feel generally unwell. This is not uncommon, as chemotherapy can affect healthy cells as well as cancer cells. Report any side effects. Continue your course of treatment even though you feel ill unless your doctor tells you to stop. Call your doctor or health care professional for advice if you get a fever, chills or sore throat, or other symptoms of a cold or flu. Do not treat yourself. This drug decreases your body's ability to fight infections. Try to avoid being around people who are sick. Avoid taking products that contain aspirin, acetaminophen, ibuprofen, naproxen, or ketoprofen unless instructed by your doctor. These medicines may hide a fever. Do not become pregnant while taking this medicine. Women should inform their doctor if they wish to become pregnant or think they might be pregnant. There is a potential for serious side effects to an unborn child. Talk to your health care professional or pharmacist for more information. Do not breast-feed an infant while taking this medicine. There is a maximum amount of this medicine you should receive throughout your life. The amount depends on the medical condition being treated and your overall health. Your doctor will watch how much of this medicine you receive in your lifetime. Tell your doctor if you have taken this medicine before. What side effects may I notice from receiving this medicine? Side effects that you should report to your doctor or health care professional  as soon as possible: -allergic reactions like skin rash, itching or hives, swelling of the face, lips, or tongue -breathing problems -chest pain -confusion -cough -fast, irregular heartbeat -feeling faint or lightheaded,  falls -fever or chills -mouth sores -pain, tingling, numbness in the hands or feet -trouble passing urine or change in the amount of urine -yellowing of the eyes or skin Side effects that usually do not require medical attention (report to your doctor or health care professional if they continue or are bothersome): -darker skin color -hair loss -irritation at site where injected -loss of appetite -nail changes -nausea and vomiting -weight loss This list may not describe all possible side effects. Call your doctor for medical advice about side effects. You may report side effects to FDA at 1-800-FDA-1088. Where should I keep my medicine? This drug is given in a hospital or clinic and will not be stored at home. NOTE: This sheet is a summary. It may not cover all possible information. If you have questions about this medicine, talk to your doctor, pharmacist, or health care provider.  2015, Elsevier/Gold Standard. (2012-04-25 09:36:48)

## 2014-07-09 NOTE — ED Provider Notes (Signed)
CSN: [id number]     Arrival date & time 07/09/14  1819 History  This chart was scribed for Dominic Grit, MD by Meriel Pica, ED Scribe. This patient was seen in room MH10/MH10 and the patient's care was started 6:50 PM.   Chief Complaint  Patient presents with  . Fever   Patient is a 44 y.o. male presenting with fever. The history is provided by the patient and a significant other. No language interpreter was used.  Fever Max temp prior to arrival:  101.9F  Temp source:  Unable to specify Severity:  Moderate Onset quality:  Sudden Duration:  7 hours Timing:  Constant Progression:  Waxing and waning Chronicity:  New Relieved by:  None tried Worsened by:  Nothing tried Ineffective treatments:  None tried Associated symptoms: cough, headaches and sore throat   Associated symptoms: no diarrhea, no dysuria, no nausea and no vomiting   Cough:    Cough characteristics:  Productive   Sputum characteristics:  Nondescript   Severity:  Mild   Onset quality:  Gradual   Duration:  5 days   Timing:  Constant   Progression:  Worsening   Chronicity:  New   HPI Comments: [name] is a 44 y.o. male, with a PMhx of testicular cancer currently undergoing chemotherapy, who presents to the Emergency Department complaining of a sudden onset, waxing and wanning, moderate fever (Tmax 101.9F) that began 7 hours ago. Pt reports the fever came on after his morning chemotherapy infusion this afternoon approximately at 12:30pm. He also notes associated headache, sore throat, and cough that have worsened since development 5 days ago. Per SOF pt has also been lethargic. During his oncology visit 5 days ago he received a chest X-ray which showed possible bronchitis but no pneumonia, during this visit the pt also stated he did not need any cough syrup for his c/o cough, nor was he given an antibiotic course. He also notes an area of erythema, pain, and warmth to his left antecubital region onset 5 days ago.  He  notes today was his last chemotherapy infusion with his previous treatment before today being 7 days ago. Temp currently is 99.9F. He denies nausea, vomiting, diarrhea, abdominal pain, back pain, or dysuria.   Past Medical History  Diagnosis Date  . Testicular mass     right  . Testicle cancer    Past Surgical History  Procedure Laterality Date  . Appendectomy  2000  . Orchiectomy Right 05/20/2014    Procedure: RADICAL ORCHIECTOMY RIGHT ;  Surgeon: Rana Snare, MD;  Location: [facility];  Service: Urology;  Laterality: Right;   No family history on file. History  Substance Use Topics  . Smoking status: Never Smoker   . Smokeless tobacco: Never Used  . Alcohol Use: No    Review of Systems  Constitutional: Positive for fever and fatigue.  HENT: Positive for sore throat.   Respiratory: Positive for cough.   Gastrointestinal: Negative for nausea, vomiting and diarrhea.  Genitourinary: Negative for dysuria.  Skin: Positive for color change.  Neurological: Positive for headaches.  All other systems reviewed and are negative.   Allergies  Review of patient's allergies indicates no known allergies.  Home Medications   Prior to Admission medications   Medication Sig Start Date End Date Taking? Authorizing Provider  dexamethasone (DECADRON) 4 MG tablet Take 2 tablets by mouth once a day on the day after chemotherapy and then take 2 tablets two times a day for 2 days.  Take with food. 06/24/14   Volanda Napoleon, MD  HYDROcodone-acetaminophen (NORCO/VICODIN) 5-325 MG per tablet Take by mouth every 6 (six) hours as needed. 05/20/14   Historical Provider, MD  LORazepam (ATIVAN) 0.5 MG tablet Take 1 tablet (0.5 mg total) by mouth every 6 (six) hours as needed (Nausea or vomiting). 06/24/14   Volanda Napoleon, MD  ondansetron (ZOFRAN) 8 MG tablet Take 1 tablet (8 mg total) by mouth 2 (two) times daily as needed. Start on the third day after chemotherapy. 06/24/14   Volanda Napoleon, MD  prochlorperazine (COMPAZINE) 10 MG tablet Take 1 tablet (10 mg total) by mouth every 6 (six) hours as needed (Nausea or vomiting). 06/24/14   Volanda Napoleon, MD   BP 115/66 mmHg  Pulse 104  Temp(Src) 99.3 F (37.4 C) (Oral)  Resp 20  Wt 206 lb (93.441 kg)  SpO2 95% Physical Exam  Constitutional: He is oriented to person, place, and time. He appears well-developed and well-nourished. No distress.  HENT:  Head: Normocephalic and atraumatic.  Mouth/Throat: Oropharynx is clear and moist. No oropharyngeal exudate or posterior oropharyngeal erythema.  Eyes: Conjunctivae are normal. Pupils are equal, round, and reactive to light. No scleral icterus.  Neck: Neck supple.  Cardiovascular: Normal rate, regular rhythm, normal heart sounds and intact distal pulses.   No murmur heard. Pulmonary/Chest: Effort normal and breath sounds normal. No stridor. No respiratory distress. He has no wheezes. He has no rales.  Abdominal: Soft. He exhibits no distension. There is no tenderness.  Musculoskeletal: Normal range of motion. He exhibits no edema.  Neurological: He is alert and oriented to person, place, and time.  Skin: Skin is warm and dry. No rash noted.  Psychiatric: He has a normal mood and affect. His behavior is normal.  Nursing note and vitals reviewed.   ED Course  Procedures  DIAGNOSTIC STUDIES: Oxygen Saturation is 95% on RA, adequate by my interpretation.    COORDINATION OF CARE: 7:00 PM Discussed treatment plan which includes to get diagnostic imaging and labs with pt. Pt acknowledges and agrees to plan.   Labs Review Labs Reviewed  CBC WITH DIFFERENTIAL/PLATELET - Abnormal; Notable for the following:    WBC 0.7 (*)    RBC 4.09 (*)    HCT 38.6 (*)    Platelets 141 (*)    Band Neutrophils 16 (*)    Neutro Abs 0.4 (*)    Lymphs Abs 0.2 (*)    All other components within normal limits  BASIC METABOLIC PANEL - Abnormal; Notable for the following:    Glucose, Bld 113  (*)    Calcium 8.6 (*)    All other components within normal limits  RAPID STREP SCREEN (NOT AT [facility])  CULTURE, BLOOD (ROUTINE X 2)  CULTURE, BLOOD (ROUTINE X 2)  CULTURE, GROUP A STREP  URINALYSIS, ROUTINE W REFLEX MICROSCOPIC (NOT AT [facility])  PATHOLOGIST SMEAR REVIEW    Imaging Review Dg Chest 2 View  07/09/2014   CLINICAL DATA:  44 year old male with history of testicular cancer and fever and sore throat after chemotherapy.  EXAM: CHEST  2 VIEW  COMPARISON:  Chest radiograph dated 07/04/2014  FINDINGS: The heart size and mediastinal contours are within normal limits. Both lungs are clear. The visualized skeletal structures are unremarkable.  IMPRESSION: No active cardiopulmonary disease.   Electronically Signed   By: Anner Crete M.D.   On: 07/09/2014 19:17     EKG Interpretation None      MDM  Final diagnoses:  Cough  Neutropenic fever    Fever with neutropenia.  Admit to [facility].    I personally performed the services described in this documentation, which was scribed in my presence. The recorded information has been reviewed and is accurate.     Dominic Grit, MD 07/09/14 2302

## 2014-07-10 DIAGNOSIS — R5081 Fever presenting with conditions classified elsewhere: Secondary | ICD-10-CM

## 2014-07-10 DIAGNOSIS — D709 Neutropenia, unspecified: Principal | ICD-10-CM

## 2014-07-10 LAB — BASIC METABOLIC PANEL
ANION GAP: 7 (ref 5–15)
BUN: 11 mg/dL (ref 6–20)
CALCIUM: 8.6 mg/dL — AB (ref 8.9–10.3)
CO2: 27 mmol/L (ref 22–32)
Chloride: 104 mmol/L (ref 101–111)
Creatinine, Ser: 0.95 mg/dL (ref 0.61–1.24)
GFR calc Af Amer: >60 mL/min (ref >60)
GFR calc non Af Amer: >60 mL/min (ref >60)
Glucose, Bld: 130 mg/dL — ABNORMAL HIGH (ref 65–99)
Potassium: 3.6 mmol/L (ref 3.5–5.1)
SODIUM: 138 mmol/L (ref 135–145)

## 2014-07-10 LAB — CBC
HCT: 36.8 % — ABNORMAL LOW (ref 39.0–52.0)
Hemoglobin: 12.2 g/dL — ABNORMAL LOW (ref 13.0–17.0)
MCH: 31.6 pg (ref 26.0–34.0)
MCHC: 33.2 g/dL (ref 30.0–36.0)
MCV: 95.3 fL (ref 78.0–100.0)
PLATELETS: 168 10*3/uL (ref 150–400)
RBC: 3.86 MIL/uL — ABNORMAL LOW (ref 4.22–5.81)
RDW: 12.6 % (ref 11.5–15.5)
WBC: 1.5 10*3/uL — ABNORMAL LOW (ref 4.0–10.5)

## 2014-07-10 LAB — VANCOMYCIN, TROUGH: Vancomycin Tr: 12 ug/mL (ref 10.0–20.0)

## 2014-07-10 MED ORDER — FLUTICASONE PROPIONATE 50 MCG/ACT NA SUSP
1.0000 | Freq: Every day | NASAL | Status: DC
  Administered 2014-07-10: 1 via NASAL
  Filled 2014-07-10: qty 16

## 2014-07-10 MED ORDER — ACETAMINOPHEN 325 MG PO TABS
650.0000 mg | ORAL_TABLET | ORAL | Status: DC | PRN
  Administered 2014-07-10: 650 mg via ORAL
  Filled 2014-07-10: qty 2

## 2014-07-10 MED ORDER — MENTHOL 3 MG MT LOZG
1.0000 | LOZENGE | OROMUCOSAL | Status: DC | PRN
  Filled 2014-07-10: qty 9

## 2014-07-10 MED ORDER — HEPARIN SODIUM (PORCINE) 5000 UNIT/ML IJ SOLN
5000.0000 [IU] | Freq: Three times a day (TID) | INTRAMUSCULAR | Status: DC
  Administered 2014-07-10: 5000 [IU] via SUBCUTANEOUS
  Filled 2014-07-10 (×7): qty 1

## 2014-07-10 MED ORDER — VANCOMYCIN HCL 10 G IV SOLR
1250.0000 mg | Freq: Three times a day (TID) | INTRAVENOUS | Status: DC
  Administered 2014-07-11: 1250 mg via INTRAVENOUS
  Filled 2014-07-10 (×2): qty 1250

## 2014-07-10 NOTE — Consult Note (Signed)
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 Fever  : I had a bad cough and fever.  HPI: Mr. Dominic Anderson is well-known to me. He is a 44 year old gentleman. 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 He went orchiectomy. I gave him one cycle of adjuvant chemotherapy with BEP. He tolerated this well. He had very little and when nausea or vomiting. He was working. He is playing baseball. He is working out.  Over the weekend, he began having a dry cough. He I actually saw my nurse practitioner on Friday. A chest x-ray was done which was negative. He did not have a temperature.  Over the weekend, the cough got worse. It was non-productive. There is no shortness of breath. There is no chest wall pain. He had no leg swelling. There is no leg pain.   On Tuesday, he got to the point where he had to go to the emergency room. He had a temperature of 101.4. His white cell count was 0.7. A chest x-ray was done which was negative. His platelet count was 141. Hemoglobin was 13.0.   He was admitted. He was started on IV antibiotics with Zosyn and vancomycin.cultures were taken.  He feels better. He is afebrile. The cultures are still pending. His cough is better.  He had no diarrhea. He had no mouth sores. He said his throat was a little bit sore.  I was informed of his admission and I am seeing him to help out with management.   Past Medical History  Diagnosis Date  . Testicular mass     right  . Testicle cancer   :  Past Surgical History  Procedure Laterality Date  . Appendectomy  2000  . Orchiectomy Right 05/20/2014    Procedure: RADICAL ORCHIECTOMY RIGHT ;  Surgeon: Dominic Snare, MD;  Location: [facility];  Service: Urology;  Laterality: Right;  :   Current facility-administered medications:  .  acetaminophen (TYLENOL) tablet  650 mg, 650 mg, Oral, Q4H PRN, Dominic Mura Schorr, NP, 650 mg at 07/10/14 2202 .  heparin injection 5,000 Units, 5,000 Units, Subcutaneous, 3 times per day, Etta Quill, DO, 5,000 Units at 07/10/14 5427 .  piperacillin-tazobactam (ZOSYN) IVPB 3.375 g, 3.375 g, Intravenous, 3 times per day, Serita Grit, MD, Last Rate: 12.5 mL/hr at 07/10/14 0625, 3.375 g at 07/10/14 0625 .  vancomycin (VANCOCIN) IVPB 1000 mg/200 mL premix, 1,000 mg, Intravenous, Q8H, Serita Grit, MD, Last Rate: 200 mL/hr at 07/10/14 0515, 1,000 mg at 07/10/14 0515:  . heparin  5,000 Units Subcutaneous 3 times per day  . piperacillin-tazobactam (ZOSYN)  IV  3.375 g Intravenous 3 times per day  . vancomycin  1,000 mg Intravenous Q8H  :  No Known Allergies:  No family history on file.:  History   Social History  . Marital Status: Married    Spouse Name: N/A  . Number of Children: N/A  . Years of Education: N/A   Occupational History  . Not on file.   Social History Main Topics  . Smoking status: Never Smoker   . Smokeless tobacco: Never Used  . Alcohol Use: No  . Drug Use: No  . Sexual Activity: Not on file   Other Topics Concern  . Not on file   Social History Narrative  :  Pertinent items are noted  in HPI.  Exam: Patient Vitals for the past 24 hrs:  BP Temp Temp src Pulse Resp SpO2 Height Weight  07/10/14 0515 131/76 mmHg 98.6 F (37 C) Oral - - 97 % - -  07/10/14 0025 125/65 mmHg 98.7 F (37.1 C) Oral 76 20 96 % 5\' 10"  (1.778 m) 203 lb 3.2 oz (92.171 kg)  07/09/14 2252 - 98.9 F (37.2 C) Oral - - - - -  07/09/14 2230 114/59 mmHg - - 74 18 94 % - -  07/09/14 2200 130/60 mmHg - - 88 19 99 % - -  07/09/14 2136 - - - 83 25 97 % - -  07/09/14 1954 133/65 mmHg 99.4 F (37.4 C) Oral 80 16 96 % - -  07/09/14 1828 115/66 mmHg 99.3 F (37.4 C) Oral 104 20 95 % - 206 lb (93.441 kg)   Well-developed and well-nourished white gentleman no obvious distress. His head and neck exam shows no mucositis.  There is no adenopathy in the neck. Lungs show good breath sounds bilaterally. No wheezes are noted. Some occasional crackles are noted. Cardiac exam shows a regular rate and rhythm with no murmurs, rubs or bruits.  Abdomen is soft. Has good bowel sounds. There is no fluid wave. There is no palpable liver or spleen tip. Back exam shows no tenderness over the spine, ribs or hips.  Extremity shows no clubbing, cyanosis or edema. He has no venous cord in his legs. He has good range of motion of his joints. Skin exam shows no obvious rashes. Neurological exam is nonfocal.   Recent Labs  07/09/14 1920 07/10/14 0411  WBC 0.7* 1.5*  HGB 13.0 12.2*  HCT 38.6* 36.8*  PLT 141* 168    Recent Labs  07/09/14 1920 07/10/14 0411  NA 135 138  K 3.6 3.6  CL 102 104  CO2 25 27  GLUCOSE 113* 130*  BUN 16 11  CREATININE 0.91 0.95  CALCIUM 8.6* 8.6*    Blood smear review: none  Pathology:none    Assessment and Plan: [name] is a 44 year old gentleman with high-risk stage I testicular cancer. He had adjuvant therapy with 1 cycle of chemotherapy with  BEP.   I think the cough might be from his bleomycin. This is not uncommon. He really has not had a mouth bleomycin that would cause any type of lung damage. I don't think he needs a CT scan.  His white cell count is coming up.  I don't think he needs any type of white cell stimulator.  I probably would  Watch him today. If his cultures come back negative, I would stop antibiotics.if he remains afebrile, that would consider discharge tomorrow.  i very much appreciate the great care that he is getting from the hospitalist and from the staff on [place].  [place] 4:13

## 2014-07-10 NOTE — Progress Notes (Signed)
ANTIBIOTIC CONSULT NOTE - FOLLOW UP  Pharmacy Consult for Vancomycin Indication: Febrile neutropenia  See pharmacist note from earlier today for further detail.  Patient is on Vancomycin 1g IV q8h and vancomycin trough tonight was subtherapeutic at 12 (goal 15-20 mcg/ml).  Plan: Increase Vancomycin to 1250 mg IV q8h.  Hershal Coria, PharmD, BCPS Pager: [phone] 07/10/2014 9:59 PM

## 2014-07-10 NOTE — H&P (Signed)
[facility] History and Physical  Dominic Anderson IPJ:[id number] DOB: 1970/09/10 DOA: 07/09/2014  Referring physician: EDP PCP: No PCP Per Patient   Chief Complaint: Febrile neutropenia   HPI: Dominic Anderson is a 44 y.o. male with h/o stage 1 testicular cancer s/p resection and just finished last dose of chemotherapy on Monday with bleomycin.  He recently had 5 days of carboplatin ending on 6/17 as well.  Patient presents to the ED at [facility] with fever of 101.3.  Symptoms onset 7 hrs PTA, waxing and waning in intensity.  Has had productive cough for past 5 days but assumed this was more of the bleomycin toxicity (which he has had throughout his chemo course).  Review of Systems: Systems reviewed.  As above, otherwise negative  Past Medical History  Diagnosis Date  . Testicular mass     right  . Testicle cancer    Past Surgical History  Procedure Laterality Date  . Appendectomy  2000  . Orchiectomy Right 05/20/2014    Procedure: RADICAL ORCHIECTOMY RIGHT ;  Surgeon: Rana Snare, MD;  Location: [facility];  Service: Urology;  Laterality: Right;   Social History:  reports that he has never smoked. He has never used smokeless tobacco. He reports that he does not drink alcohol or use illicit drugs.  No Known Allergies  No family history on file.   Prior to Admission medications   Medication Sig Start Date End Date Taking? Authorizing Provider  dexamethasone (DECADRON) 4 MG tablet Take 2 tablets by mouth once a day on the day after chemotherapy and then take 2 tablets two times a day for 2 days. Take with food. 06/24/14  Yes Volanda Napoleon, MD  dextromethorphan (DELSYM) 30 MG/5ML liquid Take 60 mg by mouth 2 (two) times daily as needed for cough.   Yes Historical Provider, MD  HYDROcodone-acetaminophen (NORCO/VICODIN) 5-325 MG per tablet Take 1 tablet by mouth every 6 (six) hours as needed.  05/20/14  Yes Historical Provider, MD  ibuprofen (ADVIL,MOTRIN) 200 MG tablet Take  800 mg by mouth every 6 (six) hours as needed (for headache.).   Yes Historical Provider, MD  LORazepam (ATIVAN) 0.5 MG tablet Take 1 tablet (0.5 mg total) by mouth every 6 (six) hours as needed (Nausea or vomiting). 06/24/14  Yes Volanda Napoleon, MD  ondansetron (ZOFRAN) 8 MG tablet Take 1 tablet (8 mg total) by mouth 2 (two) times daily as needed. Start on the third day after chemotherapy. 06/24/14  Yes Volanda Napoleon, MD  prochlorperazine (COMPAZINE) 10 MG tablet Take 1 tablet (10 mg total) by mouth every 6 (six) hours as needed (Nausea or vomiting). 06/24/14  Yes Volanda Napoleon, MD   Physical Exam: Filed Vitals:   07/10/14 0025  BP: 125/65  Pulse: 76  Temp: 98.7 F (37.1 C)  Resp: 20    BP 125/65 mmHg  Pulse 76  Temp(Src) 98.7 F (37.1 C) (Oral)  Resp 20  Ht 5\' 10"  (1.778 m)  Wt 92.171 kg (203 lb 3.2 oz)  BMI 29.16 kg/m2  SpO2 96%  General Appearance:    Alert, oriented, no distress, appears stated age  Head:    Normocephalic, atraumatic  Eyes:    PERRL, EOMI, sclera non-icteric        Nose:   Nares without drainage or epistaxis. Mucosa, turbinates normal  Throat:   Moist mucous membranes. Oropharynx without erythema or exudate.  Neck:   Supple. No carotid bruits.  No thyromegaly.  No lymphadenopathy.  Back:     No CVA tenderness, no spinal tenderness  Lungs:     Clear to auscultation bilaterally, without wheezes, rhonchi or rales  Chest wall:    No tenderness to palpitation  Heart:    Regular rate and rhythm without murmurs, gallops, rubs  Abdomen:     Soft, non-tender, nondistended, normal bowel sounds, no organomegaly  Genitalia:    deferred  Rectal:    deferred  Extremities:   No clubbing, cyanosis or edema.  Pulses:   2+ and symmetric all extremities  Skin:   Skin color, texture, turgor normal, no rashes or lesions  Lymph nodes:   Cervical, supraclavicular, and axillary nodes normal  Neurologic:   CNII-XII intact. Normal strength, sensation and reflexes       throughout    Labs on Admission:  Basic Metabolic Panel:  Recent Labs Lab 07/04/14 1303 07/09/14 1920  NA 137 135  K 3.8 3.6  CL 96* 102  CO2 27 25  GLUCOSE 119* 113*  BUN 22 16  CREATININE 1.3* 0.91  CALCIUM 9.0 8.6*   Liver Function Tests:  Recent Labs Lab 07/04/14 1303  AST 44*  ALT 135*  ALKPHOS 51  BILITOT 0.80  PROT 6.6   No results for input(s): LIPASE, AMYLASE in the last 168 hours. No results for input(s): AMMONIA in the last 168 hours. CBC:  Recent Labs Lab 07/04/14 1303 07/09/14 1920  WBC 4.1 0.7*  NEUTROABS 2.9 0.4*  HGB 13.6 13.0  HCT 39.0 38.6*  MCV 94 94.4  PLT 133* 141*   Cardiac Enzymes: No results for input(s): CKTOTAL, CKMB, CKMBINDEX, TROPONINI in the last 168 hours.  BNP (last 3 results) No results for input(s): PROBNP in the last 8760 hours. CBG: No results for input(s): GLUCAP in the last 168 hours.  Radiological Exams on Admission: Dg Chest 2 View  07/09/2014   CLINICAL DATA:  44 year old male with history of testicular cancer and fever and sore throat after chemotherapy.  EXAM: CHEST  2 VIEW  COMPARISON:  Chest radiograph dated 07/04/2014  FINDINGS: The heart size and mediastinal contours are within normal limits. Both lungs are clear. The visualized skeletal structures are unremarkable.  IMPRESSION: No active cardiopulmonary disease.   Electronically Signed   By: Anner Crete M.D.   On: 07/09/2014 19:17    EKG: Independently reviewed.  Assessment/Plan Principal Problem:   Neutropenic fever Active Problems:   Testicular cancer   1. Neutropenic fever - 1. Empiric zosyn and vanc 2. BCx and sputum Cultures pending 3. Neutropenic precautions 4. Repeat CBC daily 5. Oncology consulted by EDP 1. Consider neupogen when they evaluate in AM 2. Patient of Dr. Marin Olp    Code Status: Full Code  Family Communication: Family at bedside Disposition Plan: Admit to inpatient   Time spent: 70 min  Dominic Anderson M. [facility] Pager [phone]  If 7AM-7PM, please contact the day team taking care of the patient [email or web address] Password [email or web address] 07/10/2014, 1:58 AM

## 2014-07-10 NOTE — Progress Notes (Signed)
ANTIBIOTIC CONSULT NOTE - FOLLOW UP  Pharmacy Consult for Vancomycin, Zosyn Indication: Febrile neutropenia  No Known Allergies  Patient Measurements: Height: 5\' 10"  (177.8 cm) Weight: 203 lb 3.2 oz (92.171 kg) IBW/kg (Calculated) : 73  Vital Signs: Temp: 98.6 F (37 C) (06/29 0515) Temp Source: Oral (06/29 0515) BP: 131/76 mmHg (06/29 0515) Pulse Rate: 76 (06/29 0025) Intake/Output from previous day:    Labs:  Recent Labs  07/09/14 1920 07/10/14 0411  WBC 0.7* 1.5*  HGB 13.0 12.2*  PLT 141* 168  CREATININE 0.91 0.95   Estimated Creatinine Clearance: 116.8 mL/min (by C-G formula based on Cr of 0.95). No results for input(s): VANCOTROUGH, VANCOPEAK, VANCORANDOM, GENTTROUGH, GENTPEAK, GENTRANDOM, TOBRATROUGH, TOBRAPEAK, TOBRARND, AMIKACINPEAK, AMIKACINTROU, AMIKACIN in the last 72 hours.   Microbiology: Recent Results (from the past 720 hour(s))  Rapid strep screen     Status: None   Collection Time: 07/09/14  7:18 PM  Result Value Ref Range Status   Streptococcus, Group A Screen (Direct) NEGATIVE NEGATIVE Final    Comment: (NOTE) A Rapid Antigen test may result negative if the antigen level in the sample is below the detection level of this test. The FDA has not cleared this test as a stand-alone test therefore the rapid antigen negative result has reflexed to a Group A Strep culture.     Anti-infectives    Start     Dose/Rate Route Frequency Ordered Stop   07/09/14 2100  vancomycin (VANCOCIN) IVPB 1000 mg/200 mL premix     1,000 mg 200 mL/hr over 60 Minutes Intravenous Every 8 hours 07/09/14 2055     07/09/14 2100  piperacillin-tazobactam (ZOSYN) IVPB 3.375 g     3.375 g 12.5 mL/hr over 240 Minutes Intravenous 3 times per day 07/09/14 2055        Assessment: 44 yoM admitted 6/28 with fever after Bleomycin infusion (last day of planned 1 cycle of BEP regimen) for testicular cancer.  CXR without active pulmonary disease, labs show neutropenia.  Pharmacy is  consulted to dose vancomycin and Zosyn.  6/28 >> Zosyn >> 6/28 >> Vanc >>   Today, 07/10/2014:  Tm 99.4  WBC increased to 1.5, ANC 0.4 (6/28)  SCr 0.95 stable with CrCl > 100 ml/min  Goal of Therapy:  Vancomycin trough level 15-20 mcg/ml Appropriate abx dosing, eradication of infection.   Plan:   Zosyn 3.375g IV Q8H infused over 4hrs.  Vancomycin 1g IV q8h.  Measure Vanc trough at steady state.  Follow up renal fxn, culture results, and clinical course.   Gretta Arab PharmD, BCPS Pager [phone] 07/10/2014 9:39 AM

## 2014-07-10 NOTE — Progress Notes (Signed)
   [facility]                                                                              Patient Demographics  Dominic Anderson, is a 44 y.o. male, DOB - 1970-04-24, ESP:[id number]  Admit date - 07/09/2014   Admitting Physician Allyne Gee, MD  Outpatient Primary MD for the patient is No PCP Per Patient  LOS - 1   Chief Complaint  Patient presents with  . Fever      HPI on 07/10/2014 by Dr. Jennette Kettle Dominic Anderson is a 44 y.o. male with h/o stage 1 testicular cancer s/p resection and just finished last dose of chemotherapy on Monday with bleomycin. He recently had 5 days of carboplatin ending on 6/17 as well. Patient presents to the ED at [facility] with fever of 101.3. Symptoms onset 7 hrs PTA, waxing and waning in intensity. Has had productive cough for past 5 days but assumed this was more of the bleomycin toxicity (which he has had throughout his chemo course).  Assessment & Plan   Patient admitted earlier this morning by Dr. Princella Pellegrini. Please see full H&P for details. Agree with current assessment and plan.  Neutropenic fever -Continue Zosyn and vancomycin empirically -Blood cultures, sputum cultures pending -Continue neutropenic precautions -oncology consulted and appreciated, did not recommend Neupogen at this time, if cultures are negative, and patient remains afebrile, may discharge 6/30 -Patient continues to have cough however states he had bronchitis -WBC improving, 1.5 -Currently patient afebrile -chest x-ray negative for infection  Testicular cancer -Urology consulted and appreciated, patient completed adjuvant therapy  Code Status: Full  Family Communication: Girlfriend at bedside  Disposition Plan: Admitted, likely discharge 6/30 if culture negative and remains afrebrile  Time Spent in minutes   30 minutes  Procedures  None  Consults   Oncology  DVT Prophylaxis  heparin  Dominic Anderson D.O. on 07/10/2014 at 11:32 AM  Between 7am to 7pm -  Pager - [phone]  After 7pm go to [email or web address] - password [email or web address]  And look for the night coverage person covering for me after hours  [facility] Office  [phone]

## 2014-07-11 DIAGNOSIS — C6291 Malignant neoplasm of right testis, unspecified whether descended or undescended: Secondary | ICD-10-CM

## 2014-07-11 DIAGNOSIS — R05 Cough: Secondary | ICD-10-CM

## 2014-07-11 LAB — CBC WITH DIFFERENTIAL/PLATELET
BASOS ABS: 0 10*3/uL (ref 0.0–0.1)
BASOS PCT: 1 % (ref 0–1)
Eosinophils Absolute: 0 10*3/uL (ref 0.0–0.7)
Eosinophils Relative: 1 % (ref 0–5)
HEMATOCRIT: 38.2 % — AB (ref 39.0–52.0)
HEMOGLOBIN: 12.7 g/dL — AB (ref 13.0–17.0)
LYMPHS ABS: 1 10*3/uL (ref 0.7–4.0)
LYMPHS PCT: 48 % — AB (ref 12–46)
MCH: 31.8 pg (ref 26.0–34.0)
MCHC: 33.2 g/dL (ref 30.0–36.0)
MCV: 95.7 fL (ref 78.0–100.0)
Monocytes Absolute: 0.7 10*3/uL (ref 0.1–1.0)
Monocytes Relative: 37 % — ABNORMAL HIGH (ref 3–12)
NEUTROS ABS: 0.3 10*3/uL — AB (ref 1.7–7.7)
Neutrophils Relative %: 13 % — ABNORMAL LOW (ref 43–77)
PLATELETS: 188 10*3/uL (ref 150–400)
RBC: 3.99 MIL/uL — AB (ref 4.22–5.81)
RDW: 12.5 % (ref 11.5–15.5)
WBC: 2 10*3/uL — ABNORMAL LOW (ref 4.0–10.5)

## 2014-07-11 LAB — PATHOLOGIST SMEAR REVIEW

## 2014-07-11 MED ORDER — BENZONATATE 200 MG PO CAPS: 200.0000 mg | ORAL_CAPSULE | Freq: Three times a day (TID) | ORAL | Status: DC | PRN

## 2014-07-11 MED ORDER — MENTHOL 3 MG MT LOZG: 1.0000 | LOZENGE | OROMUCOSAL | Status: DC | PRN

## 2014-07-11 MED ORDER — FLUTICASONE PROPIONATE 50 MCG/ACT NA SUSP: 1.0000 | Freq: Every day | NASAL | Status: DC

## 2014-07-11 NOTE — Discharge Instructions (Signed)
Cough, Adult  A cough is a reflex that helps clear your throat and airways. It can help heal the body or may be a reaction to an irritated airway. A cough may only last 2 or 3 weeks (acute) or may last more than 8 weeks (chronic).  CAUSES Acute cough:  Viral or bacterial infections. Chronic cough:  Infections.  Allergies.  Asthma.  Post-nasal drip.  Smoking.  Heartburn or acid reflux.  Some medicines.  Chronic lung problems (COPD).  Cancer. SYMPTOMS   Cough.  Fever.  Chest pain.  Increased breathing rate.  High-pitched whistling sound when breathing (wheezing).  Colored mucus that you cough up (sputum). TREATMENT   A bacterial cough may be treated with antibiotic medicine.  A viral cough must run its course and will not respond to antibiotics.  Your caregiver may recommend other treatments if you have a chronic cough. HOME CARE INSTRUCTIONS   Only take over-the-counter or prescription medicines for pain, discomfort, or fever as directed by your caregiver. Use cough suppressants only as directed by your caregiver.  Use a cold steam vaporizer or humidifier in your bedroom or home to help loosen secretions.  Sleep in a semi-upright position if your cough is worse at night.  Rest as needed.  Stop smoking if you smoke. SEEK IMMEDIATE MEDICAL CARE IF:   You have pus in your sputum.  Your cough starts to worsen.  You cannot control your cough with suppressants and are losing sleep.  You begin coughing up blood.  You have difficulty breathing.  You develop pain which is getting worse or is uncontrolled with medicine.  You have a fever. MAKE SURE YOU:   Understand these instructions.  Will watch your condition.  Will get help right away if you are not doing well or get worse. Document Released: 06/26/2010 Document Revised: 03/22/2011 Document Reviewed: 06/26/2010 ExitCare Patient Information 2015 [employer]. This information is not intended  to replace advice given to you by your health care provider. Make sure you discuss any questions you have with your health care provider.  

## 2014-07-11 NOTE — Progress Notes (Signed)
Patient discharged to home with family, discharge instructions reviewed with patient who verbalized understanding. New RX's sent to pharmacy. 

## 2014-07-11 NOTE — Progress Notes (Signed)
Scooter is feeling well this morning. His throat is not sore. He is not coughing as much.   His blood cultures are all negative.  He is eating well. He has no nausea or vomiting. He's had no problems with diarrhea.  He is ambulating.  There's no rashes.  His labs shows white cell count to be 2.0. His monocytes up to 37%. Hemoglobin  12.7. Platelet  Count is 188,000.  On his physical exam,he is afebrile. Blood pressure 141/81. Temperature is 97.8. Oral exam shows no mucositis. Neck is without adenopathy. Lungs are clear. No rales, wheezes or rhonchi are noted. Cardiac exam regular rate and rhythm with no murmurs, rubs or bruits. Abdomen is soft. He has good bowel sounds. Extremities shows no clubbing, cyanosis or edema.  I really believe that he be discharged today. His white cell count is coming up nicely. The fact that his monocytes are so high, indicates good bone marrow recovery.   He does not need any antibiotics when he goes home.   I think we see him back in the office next week. His appointment should be scheduled already.  I very much appreciate all the great care that he is received up on 3W from everybody!!  Lum Keas  Psalm 18:49

## 2014-07-11 NOTE — Discharge Summary (Signed)
Physician Discharge Summary  Dominic Anderson KNL:[id number] DOB: 1970/05/02 DOA: 07/09/2014  PCP: No PCP Per Patient  Admit date: 07/09/2014 Discharge date: 07/11/2014  Time spent: 45 minutes  Recommendations for Outpatient Follow-up:  Patient will be discharged to home.  Patient will need to follow up with primary care provider within one week of discharge as well as Dr. Marin Olp at the specified time.  Patient should continue medications as prescribed.  Patient should follow a regular diet.  Discharge Diagnoses:  Principal Problem:   Neutropenic fever Active Problems:   Testicular cancer  Discharge Condition: Stable  Diet recommendation: Regular  Filed Weights   07/09/14 1828 07/10/14 0025  Weight: 93.441 kg (206 lb) 92.171 kg (203 lb 3.2 oz)    History of present illness:  on 07/10/2014 by Dr. Jennette Kettle Dominic Anderson is a 44 y.o. male with h/o stage 1 testicular cancer s/p resection and just finished last dose of chemotherapy on Monday with bleomycin. He recently had 5 days of carboplatin ending on 6/17 as well. Patient presents to the ED at [facility] with fever of 101.3. Symptoms onset 7 hrs PTA, waxing and waning in intensity. Has had productive cough for past 5 days but assumed this was more of the bleomycin toxicity (which he has had throughout his chemo course).  Hospital Course:  Neutropenic fever -Was placed on Zosyn and vancomycin empirically, neutropenic precautions -blood cultures show no growth to date -Oncology consulted and appreciated, did not recommend Neupogen at this time, if cultures are negative, and patient remains afebrile, may discharge today with outpatient follow up -Patient continues to have cough however states he had bronchitis -WBC improving, 2.0 -Currently patient afebrile -chest x-ray and UA negative for infection  Testicular cancer -Urology consulted and appreciated, patient completed adjuvant therapy  Recent history of  bronchitis/cough -Continue flonase for postnasal drip -Will discharge patient with tessalon   Procedures  None  Consults  Oncology  Discharge Exam: Filed Vitals:   07/11/14 0615  BP: 141/81  Pulse: 70  Temp: 97.8 F (36.6 C)  Resp: 18     General: Well developed, well nourished, NAD, appears stated age  HEENT: NCAT,  mucous membranes moist.  Cardiovascular: S1 S2 auscultated, no rubs, murmurs or gallops. Regular rate and rhythm.  Respiratory: Clear to auscultation bilaterally with equal chest rise  Abdomen: Soft, nontender, nondistended, + bowel sounds  Extremities: warm dry without cyanosis clubbing or edema  Neuro: AAOx3, nonfocal  Skin: Without rashes exudates or nodules  Psych: Normal affect and demeanor with intact judgement and insight  Discharge Instructions      Discharge Instructions    Discharge instructions    Complete by:  As directed   Patient will be discharged to home.  Patient will need to follow up with primary care provider within one week of discharge as well as Dr. Marin Olp at the specified time.  Patient should continue medications as prescribed.  Patient should follow a regular diet.  You were cared for by a hospitalist during your hospital stay. If you have any questions about your discharge medications or the care you received while you were in the hospital after you are discharged, you can call the unit and asked to speak with the hospitalist on call if the hospitalist that took care of you is not available. Once you are discharged, your primary care physician will handle any further medical issues. Please note that NO REFILLS for any discharge medications will be authorized once you are discharged, as it  is imperative that you return to your primary care physician (or establish a relationship with a primary care physician if you do not have one) for your aftercare needs so that they can reassess your need for medications and monitor your lab  values.            Medication List    STOP taking these medications        dexamethasone 4 MG tablet  Commonly known as:  DECADRON      TAKE these medications        benzonatate 200 MG capsule  Commonly known as:  TESSALON  Take 1 capsule (200 mg total) by mouth 3 (three) times daily as needed for cough.     DELSYM 30 MG/5ML liquid  Generic drug:  dextromethorphan  Take 60 mg by mouth 2 (two) times daily as needed for cough.     fluticasone 50 MCG/ACT nasal spray  Commonly known as:  FLONASE  Place 1 spray into both nostrils at bedtime.     HYDROcodone-acetaminophen 5-325 MG per tablet  Commonly known as:  NORCO/VICODIN  Take 1 tablet by mouth every 6 (six) hours as needed.     ibuprofen 200 MG tablet  Commonly known as:  ADVIL,MOTRIN  Take 800 mg by mouth every 6 (six) hours as needed (for headache.).     LORazepam 0.5 MG tablet  Commonly known as:  ATIVAN  Take 1 tablet (0.5 mg total) by mouth every 6 (six) hours as needed (Nausea or vomiting).     menthol-cetylpyridinium 3 MG lozenge  Commonly known as:  CEPACOL  Take 1 lozenge (3 mg total) by mouth as needed for sore throat.     ondansetron 8 MG tablet  Commonly known as:  ZOFRAN  Take 1 tablet (8 mg total) by mouth 2 (two) times daily as needed. Start on the third day after chemotherapy.     prochlorperazine 10 MG tablet  Commonly known as:  COMPAZINE  Take 1 tablet (10 mg total) by mouth every 6 (six) hours as needed (Nausea or vomiting).       No Known Allergies Follow-up Information    Follow up with Volanda Napoleon, MD. Schedule an appointment as soon as possible for a visit in 1 week.   Specialty:  Oncology   Why:  Hospital follow up   Contact information:   [address], [address] [place] [address] [phone]        The results of significant diagnostics from this hospitalization (including imaging, microbiology, ancillary and laboratory) are listed below for reference.     Significant Diagnostic Studies: Dg Chest 2 View  07/09/2014   CLINICAL DATA:  44 year old male with history of testicular cancer and fever and sore throat after chemotherapy.  EXAM: CHEST  2 VIEW  COMPARISON:  Chest radiograph dated 07/04/2014  FINDINGS: The heart size and mediastinal contours are within normal limits. Both lungs are clear. The visualized skeletal structures are unremarkable.  IMPRESSION: No active cardiopulmonary disease.   Electronically Signed   By: Anner Crete M.D.   On: 07/09/2014 19:17   Dg Chest 2 View  07/04/2014   CLINICAL DATA:  Cough for 2 weeks, recently on antibiotics, history of testicular carcinoma  EXAM: CHEST  2 VIEW  COMPARISON:  None.  FINDINGS: No active infiltrate or effusion is seen. Mediastinal and hilar contours are unremarkable. There may be mild peribronchial thickening present which may indicate bronchitis. The heart is within normal limits in  size. Slight elevation of the right hemidiaphragm most likely is chronic. No bony abnormality is seen.  IMPRESSION: No pneumonia.  Question bronchitis.   Electronically Signed   By: Ivar Drape M.D.   On: 07/04/2014 13:04    Microbiology: Recent Results (from the past 240 hour(s))  Culture, blood (routine x 2)     Status: None (Preliminary result)   Collection Time: 07/09/14  7:05 PM  Result Value Ref Range Status   Specimen Description BLOOD RIGHT [facility]  Final   Special Requests   Final    BOTTLES DRAWN AEROBIC AND ANAEROBIC 5CC Immunocompromised   Culture   Final    NO GROWTH < 24 HOURS Performed at [facility]    Report Status PENDING  Incomplete  Rapid strep screen     Status: None   Collection Time: 07/09/14  7:18 PM  Result Value Ref Range Status   Streptococcus, Group A Screen (Direct) NEGATIVE NEGATIVE Final    Comment: (NOTE) A Rapid Antigen test may result negative if the antigen level in the sample is below the detection level of this test. The FDA has not cleared this test as a  stand-alone test therefore the rapid antigen negative result has reflexed to a Group A Strep culture.   Culture, blood (routine x 2)     Status: None (Preliminary result)   Collection Time: 07/09/14  7:20 PM  Result Value Ref Range Status   Specimen Description BLOOD LEFT AC  Final   Special Requests   Final    BOTTLES DRAWN AEROBIC AND ANAEROBIC 5CC Immunocompromised   Culture   Final    NO GROWTH < 24 HOURS Performed at [facility]    Report Status PENDING  Incomplete     Labs: Basic Metabolic Panel:  Recent Labs Lab 07/04/14 1303 07/09/14 1920 07/10/14 0411  NA 137 135 138  K 3.8 3.6 3.6  CL 96* 102 104  CO2 27 25 27   GLUCOSE 119* 113* 130*  BUN 22 16 11   CREATININE 1.3* 0.91 0.95  CALCIUM 9.0 8.6* 8.6*   Liver Function Tests:  Recent Labs Lab 07/04/14 1303  AST 44*  ALT 135*  ALKPHOS 51  BILITOT 0.80  PROT 6.6   No results for input(s): LIPASE, AMYLASE in the last 168 hours. No results for input(s): AMMONIA in the last 168 hours. CBC:  Recent Labs Lab 07/04/14 1303 07/09/14 1920 07/10/14 0411 07/11/14 0415  WBC 4.1 0.7* 1.5* 2.0*  NEUTROABS 2.9 0.4*  --  0.3*  HGB 13.6 13.0 12.2* 12.7*  HCT 39.0 38.6* 36.8* 38.2*  MCV 94 94.4 95.3 95.7  PLT 133* 141* 168 188   Cardiac Enzymes: No results for input(s): CKTOTAL, CKMB, CKMBINDEX, TROPONINI in the last 168 hours. BNP: BNP (last 3 results) No results for input(s): BNP in the last 8760 hours.  ProBNP (last 3 results) No results for input(s): PROBNP in the last 8760 hours.  CBG: No results for input(s): GLUCAP in the last 168 hours.     SignedCristal Ford  [facility] 07/11/2014, 10:04 AM

## 2014-07-12 LAB — CULTURE, GROUP A STREP: STREP A CULTURE: NEGATIVE

## 2014-07-14 LAB — CULTURE, BLOOD (ROUTINE X 2)
CULTURE: NO GROWTH
Culture: NO GROWTH

## 2014-07-16 ENCOUNTER — Ambulatory Visit: Payer: BLUE CROSS/BLUE SHIELD

## 2014-07-24 ENCOUNTER — Other Ambulatory Visit (HOSPITAL_BASED_OUTPATIENT_CLINIC_OR_DEPARTMENT_OTHER): Payer: BLUE CROSS/BLUE SHIELD

## 2014-07-24 ENCOUNTER — Ambulatory Visit (HOSPITAL_BASED_OUTPATIENT_CLINIC_OR_DEPARTMENT_OTHER): Payer: BLUE CROSS/BLUE SHIELD | Admitting: Hematology & Oncology

## 2014-07-24 ENCOUNTER — Encounter: Payer: Self-pay | Admitting: Hematology & Oncology

## 2014-07-24 VITALS — BP 119/75 | HR 82 | Temp 98.1°F | Resp 18 | Ht 70.0 in | Wt 208.0 lb

## 2014-07-24 DIAGNOSIS — C6291 Malignant neoplasm of right testis, unspecified whether descended or undescended: Secondary | ICD-10-CM | POA: Diagnosis not present

## 2014-07-24 DIAGNOSIS — C6290 Malignant neoplasm of unspecified testis, unspecified whether descended or undescended: Secondary | ICD-10-CM

## 2014-07-24 LAB — CBC WITH DIFFERENTIAL (CANCER CENTER ONLY)
BASO#: 0.1 10*3/uL (ref 0.0–0.2)
BASO%: 1.3 % (ref 0.0–2.0)
EOS%: 0.5 % (ref 0.0–7.0)
Eosinophils Absolute: 0 10*3/uL (ref 0.0–0.5)
HCT: 38.8 % (ref 38.7–49.9)
HGB: 13.4 g/dL (ref 13.0–17.1)
LYMPH#: 1.6 10*3/uL (ref 0.9–3.3)
LYMPH%: 19.2 % (ref 14.0–48.0)
MCH: 32.6 pg (ref 28.0–33.4)
MCHC: 34.5 g/dL (ref 32.0–35.9)
MCV: 94 fL (ref 82–98)
MONO#: 0.9 10*3/uL (ref 0.1–0.9)
MONO%: 10.7 % (ref 0.0–13.0)
NEUT#: 5.6 10*3/uL (ref 1.5–6.5)
NEUT%: 68.3 % (ref 40.0–80.0)
Platelets: 254 10*3/uL (ref 145–400)
RBC: 4.11 10*6/uL — ABNORMAL LOW (ref 4.20–5.70)
RDW: 12.9 % (ref 11.1–15.7)
WBC: 8.3 10*3/uL (ref 4.0–10.0)

## 2014-07-24 LAB — COMPREHENSIVE METABOLIC PANEL
ALBUMIN: 4 g/dL (ref 3.5–5.2)
ALK PHOS: 47 U/L (ref 39–117)
ALT: 25 U/L (ref 0–53)
AST: 24 U/L (ref 0–37)
BUN: 17 mg/dL (ref 6–23)
CO2: 30 meq/L (ref 19–32)
Calcium: 9.2 mg/dL (ref 8.4–10.5)
Chloride: 103 mEq/L (ref 96–112)
Creatinine, Ser: 1.12 mg/dL (ref 0.50–1.35)
Glucose, Bld: 89 mg/dL (ref 70–99)
Potassium: 4.1 mEq/L (ref 3.5–5.3)
Sodium: 142 mEq/L (ref 135–145)
Total Bilirubin: 0.5 mg/dL (ref 0.2–1.2)
Total Protein: 6.4 g/dL (ref 6.0–8.3)

## 2014-07-24 LAB — LACTATE DEHYDROGENASE: LDH: 178 U/L (ref 94–250)

## 2014-07-24 LAB — TECHNOLOGIST REVIEW CHCC SATELLITE

## 2014-07-24 NOTE — Progress Notes (Signed)
Hematology and Oncology Follow Up Visit  Abdulloh Ullom [id number] 1970/03/06 44 y.o. 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 Coppolino is back for follow-up. He tolerated his this only cycle of chemotherapy. He had 1 cycle of BEP. He completed this about 1 month ago.  He is hospitalized after is because of febrile neutropenia. He had a cough. All the studies were negative.  He is back to work. He's pretty much working full time.  He's not going to the gym as much. He will slowly can back into this.  He lost his hair.  When we first saw him, his alpha- fetoprotein was elevated at 87. His beta hCG was less than 2. We will have to see what they are this time.  He's had no cough. He's had no leg swelling. There is are no rashes.  Overall, his performance status is ECOG 0.  Medications:    Medication List       This list is accurate as of: 07/24/14  4:07 PM.  Always use your most recent med list.               dexamethasone 4 MG tablet  Commonly known as:  DECADRON  TAKE 2 TABS BY MOUTH ONCE DAILY THE DAY AFTER CHEMO, THEN 2 TABS 2X DAILY FOR 2 DAYS. TAKE W/ FOOD.        Allergies: No Known Allergies  Past Medical History, Surgical history, Social history, and Family History were reviewed and updated.  Review of Systems: All other 10 point review of systems is negative.   Physical Exam:  height is 5\' 10"  (1.778 m) and weight is 208 lb (94.348 kg). His oral temperature is 98.1 F (36.7 C). His blood pressure is 119/75 and his pulse is 82. His respiration is 18.   Wt Readings from Last 3 Encounters:  07/24/14 208 lb (94.348 kg)  07/10/14 203 lb 3.2 oz (92.171 kg)  07/04/14 206 lb (93.441 kg)    Well-developed well-nourished white woman in no obvious distress. Head and neck exam shows no ocular or oral lesions. He  has no adenopathy in the neck. Lungs are clear. Cardiac exam regular rate and rhythm with no murmurs, rubs or bruits. Abdomen is soft. Has good bowel sounds. He has well-healed right inguinal orchiectomy scar. He has no palpable liver or spleen tip. Back exam shows no tenderness over the spine, ribs or hips. Extremities shows no clubbing, cyanosis or edema. Skin exam no rashes ecchymoses or petechia.  Lab Results  Component Value Date   WBC 8.3 07/24/2014   HGB 13.4 07/24/2014   HCT 38.8 07/24/2014   MCV 94 07/24/2014   PLT 254 07/24/2014   No results found for: FERRITIN, IRON, TIBC, UIBC, IRONPCTSAT Lab Results  Component Value Date   RBC 4.11* 07/24/2014   No results found for: KPAFRELGTCHN, LAMBDASER, KAPLAMBRATIO No results found for: IGGSERUM, IGA, IGMSERUM No results found for: Ronnald Ramp, A1GS, A2GS, Tillman Sers, SPEI   Chemistry      Component Value Date/Time   NA 138 07/10/2014 0411   NA 137 07/04/2014 1303   K 3.6 07/10/2014 0411   K 3.8 07/04/2014 1303   CL 104 07/10/2014 0411   CL 96* 07/04/2014 1303   CO2 27 07/10/2014 0411   CO2 27 07/04/2014 1303  BUN 11 07/10/2014 0411   BUN 22 07/04/2014 1303   CREATININE 0.95 07/10/2014 0411   CREATININE 1.3* 07/04/2014 1303      Component Value Date/Time   CALCIUM 8.6* 07/10/2014 0411   CALCIUM 9.0 07/04/2014 1303   ALKPHOS 51 07/04/2014 1303   ALKPHOS 44 06/14/2014 1150   AST 44* 07/04/2014 1303   AST 18 06/14/2014 1150   ALT 135* 07/04/2014 1303   ALT 34 06/14/2014 1150   BILITOT 0.80 07/04/2014 1303   BILITOT 0.5 06/14/2014 1150     Impression and Plan: Mr. Wiedeman is a 44 year old gentleman with a stage I non-seminomatous germ cell tumor. He is s/p orchiectomy by Dr. Risa Grill.   He completed his adjuvant chemotherapy.  For now, we'll plan for follow-up scans on him in about 3 months. I think a CT scan for follow-up would be standard of care for the first year every 4-6 months.  We  will have to see what his alpha fetoprotein levels are. I will I do think that these will normalize.  I want to see him back in September when he comes in for his scans.    Volanda Napoleon, MD 7/13/20164:07 PM

## 2014-07-25 ENCOUNTER — Telehealth: Payer: Self-pay | Admitting: Hematology & Oncology

## 2014-07-25 ENCOUNTER — Telehealth: Payer: Self-pay | Admitting: *Deleted

## 2014-07-25 NOTE — Telephone Encounter (Addendum)
Patient is aware of results.   ----- Message from Volanda Napoleon, MD sent at 07/25/2014  7:07 AM EDT ----- Call - labs look great!!!  Tumor markers are normal!!!  Laurey Arrow

## 2014-07-25 NOTE — Telephone Encounter (Signed)
Contacted pt and informed him of CT and Lab appt and times. Pt confirmed appt

## 2014-07-27 LAB — AFP TUMOR MARKER: AFP TUMOR MARKER: 2.5 ng/mL (ref <6.1)

## 2014-07-27 LAB — BETA HCG QUANT (REF LAB)

## 2014-09-23 ENCOUNTER — Other Ambulatory Visit (HOSPITAL_BASED_OUTPATIENT_CLINIC_OR_DEPARTMENT_OTHER): Payer: BLUE CROSS/BLUE SHIELD

## 2014-09-23 DIAGNOSIS — C6291 Malignant neoplasm of right testis, unspecified whether descended or undescended: Secondary | ICD-10-CM | POA: Diagnosis not present

## 2014-09-23 LAB — CBC WITH DIFFERENTIAL (CANCER CENTER ONLY)
BASO#: 0.1 10*3/uL (ref 0.0–0.2)
BASO%: 0.9 % (ref 0.0–2.0)
EOS%: 3.5 % (ref 0.0–7.0)
Eosinophils Absolute: 0.2 10*3/uL (ref 0.0–0.5)
HEMATOCRIT: 41.4 % (ref 38.7–49.9)
HGB: 14.1 g/dL (ref 13.0–17.1)
LYMPH#: 1.3 10*3/uL (ref 0.9–3.3)
LYMPH%: 22.5 % (ref 14.0–48.0)
MCH: 32.7 pg (ref 28.0–33.4)
MCHC: 34.1 g/dL (ref 32.0–35.9)
MCV: 96 fL (ref 82–98)
MONO#: 0.5 10*3/uL (ref 0.1–0.9)
MONO%: 8.5 % (ref 0.0–13.0)
NEUT#: 3.7 10*3/uL (ref 1.5–6.5)
NEUT%: 64.6 % (ref 40.0–80.0)
Platelets: 177 10*3/uL (ref 145–400)
RBC: 4.31 10*6/uL (ref 4.20–5.70)
RDW: 11.8 % (ref 11.1–15.7)
WBC: 5.7 10*3/uL (ref 4.0–10.0)

## 2014-09-26 LAB — LACTATE DEHYDROGENASE: LDH: 129 U/L (ref 94–250)

## 2014-09-26 LAB — COMPREHENSIVE METABOLIC PANEL
ALK PHOS: 37 U/L — AB (ref 40–115)
ALT: 17 U/L (ref 9–46)
AST: 14 U/L (ref 10–40)
Albumin: 4 g/dL (ref 3.6–5.1)
BILIRUBIN TOTAL: 0.5 mg/dL (ref 0.2–1.2)
BUN: 17 mg/dL (ref 7–25)
CO2: 25 mmol/L (ref 20–31)
CREATININE: 1.42 mg/dL — AB (ref 0.60–1.35)
Calcium: 9 mg/dL (ref 8.6–10.3)
Chloride: 105 mmol/L (ref 98–110)
GLUCOSE: 101 mg/dL — AB (ref 65–99)
POTASSIUM: 3.9 mmol/L (ref 3.5–5.3)
SODIUM: 140 mmol/L (ref 135–146)
Total Protein: 6.3 g/dL (ref 6.1–8.1)

## 2014-09-26 LAB — AFP TUMOR MARKER: AFP TUMOR MARKER: 2.2 ng/mL (ref <6.1)

## 2014-09-26 LAB — BETA HCG QUANT (REF LAB)

## 2014-09-27 ENCOUNTER — Telehealth: Payer: Self-pay

## 2014-09-27 NOTE — Telephone Encounter (Addendum)
-----   Message from Volanda Napoleon, MD sent at 09/27/2014  7:07 AM EDT -----  Pt notified of the following information via phone -    Call - labs and tumor markers look great!!!  Need to drink a LOT of water!!!!  Kidneys look like they might be a little dehyrated!!  pete

## 2014-10-02 ENCOUNTER — Encounter: Payer: Self-pay | Admitting: Hematology & Oncology

## 2014-10-02 ENCOUNTER — Ambulatory Visit (HOSPITAL_BASED_OUTPATIENT_CLINIC_OR_DEPARTMENT_OTHER)
Admission: RE | Admit: 2014-10-02 | Discharge: 2014-10-02 | Disposition: A | Discharge status: Home / Self Care | Payer: BLUE CROSS/BLUE SHIELD | Source: Ambulatory Visit | Attending: Hematology & Oncology | Admitting: Hematology & Oncology

## 2014-10-02 ENCOUNTER — Telehealth: Payer: Self-pay | Admitting: Hematology & Oncology

## 2014-10-02 ENCOUNTER — Ambulatory Visit (HOSPITAL_BASED_OUTPATIENT_CLINIC_OR_DEPARTMENT_OTHER): Payer: BLUE CROSS/BLUE SHIELD | Admitting: Hematology & Oncology

## 2014-10-02 VITALS — BP 132/74 | HR 62 | Temp 97.4°F | Resp 16 | Ht 70.0 in | Wt 205.0 lb

## 2014-10-02 DIAGNOSIS — C6291 Malignant neoplasm of right testis, unspecified whether descended or undescended: Secondary | ICD-10-CM | POA: Diagnosis not present

## 2014-10-02 DIAGNOSIS — R911 Solitary pulmonary nodule: Secondary | ICD-10-CM | POA: Insufficient documentation

## 2014-10-02 MED ORDER — IOHEXOL 300 MG/ML  SOLN
100.0000 mL | Freq: Once | INTRAMUSCULAR | Status: AC | PRN
  Administered 2014-10-02: 100 mL via INTRAVENOUS

## 2014-10-02 NOTE — Progress Notes (Signed)
Hematology and Oncology Follow Up Visit  Dominic Anderson [id number] 08/06/1970 44 y.o. 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 He tolerated his this only cycle of chemotherapy. He had 1 cycle of BEP. He completed this about 1 month ago.  He feels great. He's working out 4 times a week. He is hunting. He really isn't joint himself.  His son got married this summer. He was able to be the wetting and really enjoyed it.  We went ahead and did his scans today. Everything looked fine. The radiologist's comment on a stable 3 mm right upper lobe nodule.  His tumor markers are all normal. His alpha-fetoprotein is 2.2. His beta-hCG is less than 2. His LDH is 129.  Again, he is working without difficulties. He does not have any fatigue. He's had no problems with bowels or bladder. Her every saw his urologist a couple weeks ago. Everything looked fine with his orchiectomy scar.   Overall, his performance status is ECOG 0.  Medications:    Medication List    Notice  As of 10/02/2014 10:22 AM   You have not been prescribed any medications.      Allergies: No Known Allergies  Past Medical History, Surgical history, Social history, and Family History were reviewed and updated.  Review of Systems: All other 10 point review of systems is negative.   Physical Exam:  height is 5\' 10"  (1.778 m) and weight is 205 lb (92.987 kg). His oral temperature is 97.4 F (36.3 C). His blood pressure is 132/74 and his pulse is 62. His respiration is 16.   Wt Readings from Last 3 Encounters:  10/02/14 205 lb (92.987 kg)  07/24/14 208 lb (94.348 kg)  07/10/14 203 lb 3.2 oz (92.171 kg)    Well-developed well-nourished white woman in no obvious distress. Head and neck exam shows no ocular or oral lesions. He has no adenopathy  in the neck. Lungs are clear. Cardiac exam regular rate and rhythm with no murmurs, rubs or bruits. Abdomen is soft. Has good bowel sounds. He has well-healed right inguinal orchiectomy scar. He has no palpable liver or spleen tip. Back exam shows no tenderness over the spine, ribs or hips. Extremities shows no clubbing, cyanosis or edema. Skin exam no rashes ecchymoses or petechia.  Lab Results  Component Value Date   WBC 5.7 09/23/2014   HGB 14.1 09/23/2014   HCT 41.4 09/23/2014   MCV 96 09/23/2014   PLT 177 09/23/2014   No results found for: FERRITIN, IRON, TIBC, UIBC, IRONPCTSAT Lab Results  Component Value Date   RBC 4.31 09/23/2014   No results found for: KPAFRELGTCHN, LAMBDASER, KAPLAMBRATIO No results found for: IGGSERUM, IGA, IGMSERUM No results found for: Odetta Pink, SPEI   Chemistry      Component Value Date/Time   NA 140 09/23/2014 1514   NA 137 07/04/2014 1303   K 3.9 09/23/2014 1514   K 3.8 07/04/2014 1303   CL 105 09/23/2014 1514   CL 96* 07/04/2014 1303   CO2 25 09/23/2014 1514   CO2 27 07/04/2014 1303   BUN 17 09/23/2014 1514   BUN 22 07/04/2014 1303   CREATININE 1.42* 09/23/2014 1514   CREATININE 1.3* 07/04/2014 1303      Component Value  Date/Time   CALCIUM 9.0 09/23/2014 1514   CALCIUM 9.0 07/04/2014 1303   ALKPHOS 37* 09/23/2014 1514   ALKPHOS 51 07/04/2014 1303   AST 14 09/23/2014 1514   AST 44* 07/04/2014 1303   ALT 17 09/23/2014 1514   ALT 135* 07/04/2014 1303   BILITOT 0.5 09/23/2014 1514   BILITOT 0.80 07/04/2014 1303     Impression and Plan: Dominic Anderson is a 44 year old gentleman with a stage I non-seminomatous germ cell tumor. He is s/p orchiectomy by Dr. Risa Grill.   He completed his adjuvant chemotherapy in early July 2016.  His CT scans are great. This 3 mm indeterminate pulmonary nodule does not really bother me. His tumor markers are normal.  We will plan for a follow-up CT scan in 3  more months. We will see him the same day he has his scans done.  He will be doing a lot of hunting this season. He is looking for to this. Volanda Napoleon, MD 9/21/201610:22 AM

## 2014-10-02 NOTE — Telephone Encounter (Signed)
Called patient's cell #. L/m stating patient's appts for December 2016. Will send schedule calendar also.       [facility].

## 2014-12-25 ENCOUNTER — Ambulatory Visit (HOSPITAL_BASED_OUTPATIENT_CLINIC_OR_DEPARTMENT_OTHER): Payer: Commercial Managed Care - HMO | Admitting: Hematology & Oncology

## 2014-12-25 ENCOUNTER — Ambulatory Visit (HOSPITAL_BASED_OUTPATIENT_CLINIC_OR_DEPARTMENT_OTHER)
Admission: RE | Admit: 2014-12-25 | Discharge: 2014-12-25 | Disposition: A | Discharge status: Home / Self Care | Payer: Commercial Managed Care - HMO | Source: Ambulatory Visit | Attending: Hematology & Oncology | Admitting: Hematology & Oncology

## 2014-12-25 ENCOUNTER — Encounter: Payer: Self-pay | Admitting: Hematology & Oncology

## 2014-12-25 ENCOUNTER — Encounter (HOSPITAL_BASED_OUTPATIENT_CLINIC_OR_DEPARTMENT_OTHER): Payer: Self-pay

## 2014-12-25 ENCOUNTER — Other Ambulatory Visit (HOSPITAL_BASED_OUTPATIENT_CLINIC_OR_DEPARTMENT_OTHER): Payer: Commercial Managed Care - HMO

## 2014-12-25 VITALS — BP 125/70 | HR 64 | Temp 97.5°F | Resp 16 | Ht 70.0 in | Wt 204.0 lb

## 2014-12-25 DIAGNOSIS — R918 Other nonspecific abnormal finding of lung field: Secondary | ICD-10-CM | POA: Diagnosis not present

## 2014-12-25 DIAGNOSIS — Z9079 Acquired absence of other genital organ(s): Secondary | ICD-10-CM | POA: Diagnosis not present

## 2014-12-25 DIAGNOSIS — C6291 Malignant neoplasm of right testis, unspecified whether descended or undescended: Secondary | ICD-10-CM

## 2014-12-25 DIAGNOSIS — C6211 Malignant neoplasm of descended right testis: Secondary | ICD-10-CM

## 2014-12-25 LAB — CBC WITH DIFFERENTIAL (CANCER CENTER ONLY)
BASO#: 0 10*3/uL (ref 0.0–0.2)
BASO%: 0.6 % (ref 0.0–2.0)
EOS ABS: 0.1 10*3/uL (ref 0.0–0.5)
EOS%: 2.2 % (ref 0.0–7.0)
HCT: 44.7 % (ref 38.7–49.9)
HEMOGLOBIN: 15.3 g/dL (ref 13.0–17.1)
LYMPH#: 1.2 10*3/uL (ref 0.9–3.3)
LYMPH%: 22.5 % (ref 14.0–48.0)
MCH: 31.7 pg (ref 28.0–33.4)
MCHC: 34.2 g/dL (ref 32.0–35.9)
MCV: 93 fL (ref 82–98)
MONO#: 0.6 10*3/uL (ref 0.1–0.9)
MONO%: 11.8 % (ref 0.0–13.0)
NEUT%: 62.9 % (ref 40.0–80.0)
NEUTROS ABS: 3.4 10*3/uL (ref 1.5–6.5)
PLATELETS: 173 10*3/uL (ref 145–400)
RBC: 4.82 10*6/uL (ref 4.20–5.70)
RDW: 12.4 % (ref 11.1–15.7)
WBC: 5.4 10*3/uL (ref 4.0–10.0)

## 2014-12-25 LAB — CMP (CANCER CENTER ONLY)
ALBUMIN: 3.8 g/dL (ref 3.3–5.5)
ALK PHOS: 36 U/L (ref 26–84)
ALT: 24 U/L (ref 10–47)
AST: 20 U/L (ref 11–38)
BILIRUBIN TOTAL: 1 mg/dL (ref 0.20–1.60)
BUN, Bld: 18 mg/dL (ref 7–22)
CALCIUM: 9 mg/dL (ref 8.0–10.3)
CO2: 25 meq/L (ref 18–33)
Chloride: 105 mEq/L (ref 98–108)
Creat: 1.1 mg/dl (ref 0.6–1.2)
Glucose, Bld: 111 mg/dL (ref 73–118)
Potassium: 4 mEq/L (ref 3.3–4.7)
Sodium: 139 mEq/L (ref 128–145)
TOTAL PROTEIN: 6.9 g/dL (ref 6.4–8.1)

## 2014-12-25 LAB — LACTATE DEHYDROGENASE: LDH: 159 U/L (ref 125–245)

## 2014-12-25 MED ORDER — IOHEXOL 300 MG/ML  SOLN
100.0000 mL | Freq: Once | INTRAMUSCULAR | Status: AC | PRN
  Administered 2014-12-25: 100 mL via INTRAVENOUS

## 2014-12-25 NOTE — Progress Notes (Signed)
Hematology and Oncology Follow Up Visit  Dominic Anderson [id number] 1970-11-17 44 y.o. 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 He tolerated his this only cycle of chemotherapy. He had 1 cycle of BEP. He completed this about 4 months ago.  He feels great.  He is doing a lot of hunting. He actually is going hunting today.  He is working full time. He is quite busy at work.  His last tumor markers back in  In September showed an alpha-fetoprotein of 2.2 and beta-hCG less than 2.0.   He's had no change in bowel or bladder habits. He's had no rashes. He's had no leg swelling. He's had no cough. He's had no fever. He's had no significant fatigue or weakness.  Overall, his performance status is ECOG 0.  Medications:    Medication List    Notice  As of 12/25/2014 11:33 AM   You have not been prescribed any medications.      Allergies: No Known Allergies  Past Medical History, Surgical history, Social history, and Family History were reviewed and updated.  Review of Systems: All other 10 point review of systems is negative.   Physical Exam:  height is 5\' 10"  (1.778 m) and weight is 204 lb (92.534 kg). His oral temperature is 97.5 F (36.4 C). His blood pressure is 125/70 and his pulse is 64. His respiration is 16.   Wt Readings from Last 3 Encounters:  12/25/14 204 lb (92.534 kg)  10/02/14 205 lb (92.987 kg)  07/24/14 208 lb (94.348 kg)    Well-developed well-nourished white woman in no obvious distress. Head and neck exam shows no ocular or oral lesions. He has no adenopathy in the neck. Lungs are clear. Cardiac exam regular rate and rhythm with no murmurs, rubs or bruits. Abdomen is soft. Has good bowel sounds. He has well-healed right inguinal orchiectomy scar. He has no palpable liver or spleen  tip. Back exam shows no tenderness over the spine, ribs or hips. Extremities shows no clubbing, cyanosis or edema. Skin exam no rashes ecchymoses or petechia.  Lab Results  Component Value Date   WBC 5.4 12/25/2014   HGB 15.3 12/25/2014   HCT 44.7 12/25/2014   MCV 93 12/25/2014   PLT 173 12/25/2014   No results found for: FERRITIN, IRON, TIBC, UIBC, IRONPCTSAT Lab Results  Component Value Date   RBC 4.82 12/25/2014   No results found for: KPAFRELGTCHN, LAMBDASER, KAPLAMBRATIO No results found for: IGGSERUM, IGA, IGMSERUM No results found for: Odetta Pink, SPEI   Chemistry      Component Value Date/Time   NA 139 12/25/2014 0826   NA 140 09/23/2014 1514   K 4.0 12/25/2014 0826   K 3.9 09/23/2014 1514   CL 105 12/25/2014 0826   CL 105 09/23/2014 1514   CO2 25 12/25/2014 0826   CO2 25 09/23/2014 1514   BUN 18 12/25/2014 0826   BUN 17 09/23/2014 1514   CREATININE 1.1 12/25/2014 0826   CREATININE 1.42* 09/23/2014 1514      Component Value Date/Time   CALCIUM 9.0 12/25/2014 0826   CALCIUM 9.0 09/23/2014 1514   ALKPHOS 36 12/25/2014 0826   ALKPHOS 37* 09/23/2014 1514   AST 20 12/25/2014 0826   AST 14 09/23/2014 1514  ALT 24 12/25/2014 0826   ALT 17 09/23/2014 1514   BILITOT 1.00 12/25/2014 0826   BILITOT 0.5 09/23/2014 1514     Impression and Plan: Dominic Anderson is a 44 year old gentleman with a stage I non-seminomatous germ cell tumor. He is s/p orchiectomy by Dr. Risa Grill.   He completed his adjuvant chemotherapy in early July 2016.  His CT scans are great. This 3 mm indeterminate pulmonary nodule does not really bother me. His tumor markers are normal.  We will plan for a follow-up CT scan in  4 more months. We will see him the same day he has his scans done.  He will be doing a lot of hunting this season. He is looking for to this. Volanda Napoleon, MD 12/14/201611:33 AM

## 2014-12-26 LAB — AFP TUMOR MARKER: AFP-Tumor Marker: 2.3 ng/mL (ref <6.1)

## 2014-12-28 LAB — BETA HCG QUANT (REF LAB)

## 2015-01-08 ENCOUNTER — Other Ambulatory Visit: Payer: Self-pay | Admitting: Nurse Practitioner

## 2015-05-07 ENCOUNTER — Encounter (HOSPITAL_BASED_OUTPATIENT_CLINIC_OR_DEPARTMENT_OTHER): Payer: Self-pay

## 2015-05-07 ENCOUNTER — Other Ambulatory Visit (HOSPITAL_BASED_OUTPATIENT_CLINIC_OR_DEPARTMENT_OTHER): Payer: Commercial Managed Care - HMO

## 2015-05-07 ENCOUNTER — Encounter (HOSPITAL_BASED_OUTPATIENT_CLINIC_OR_DEPARTMENT_OTHER)
Admission: RE | Admit: 2015-05-07 | Discharge: 2015-05-07 | Disposition: A | Discharge status: Home / Self Care | Payer: Commercial Managed Care - HMO | Source: Ambulatory Visit | Attending: Hematology & Oncology | Admitting: Hematology & Oncology

## 2015-05-07 ENCOUNTER — Ambulatory Visit (HOSPITAL_BASED_OUTPATIENT_CLINIC_OR_DEPARTMENT_OTHER)
Admission: RE | Admit: 2015-05-07 | Discharge: 2015-05-07 | Disposition: A | Discharge status: Home / Self Care | Payer: Commercial Managed Care - HMO | Source: Ambulatory Visit | Attending: Hematology & Oncology | Admitting: Hematology & Oncology

## 2015-05-07 ENCOUNTER — Ambulatory Visit (HOSPITAL_BASED_OUTPATIENT_CLINIC_OR_DEPARTMENT_OTHER): Payer: Commercial Managed Care - HMO | Admitting: Hematology & Oncology

## 2015-05-07 VITALS — BP 128/88 | HR 55 | Temp 97.5°F | Wt 207.1 lb

## 2015-05-07 DIAGNOSIS — C6201 Malignant neoplasm of undescended right testis: Secondary | ICD-10-CM | POA: Diagnosis not present

## 2015-05-07 DIAGNOSIS — C6211 Malignant neoplasm of descended right testis: Secondary | ICD-10-CM | POA: Insufficient documentation

## 2015-05-07 DIAGNOSIS — R911 Solitary pulmonary nodule: Secondary | ICD-10-CM

## 2015-05-07 LAB — CBC WITH DIFFERENTIAL (CANCER CENTER ONLY)
BASO#: 0 10*3/uL (ref 0.0–0.2)
BASO%: 0.7 % (ref 0.0–2.0)
EOS ABS: 0.1 10*3/uL (ref 0.0–0.5)
EOS%: 1.5 % (ref 0.0–7.0)
HEMATOCRIT: 48 % (ref 38.7–49.9)
HEMOGLOBIN: 16.6 g/dL (ref 13.0–17.1)
LYMPH#: 1.3 10*3/uL (ref 0.9–3.3)
LYMPH%: 21.2 % (ref 14.0–48.0)
MCH: 32.3 pg (ref 28.0–33.4)
MCHC: 34.6 g/dL (ref 32.0–35.9)
MCV: 93 fL (ref 82–98)
MONO#: 0.6 10*3/uL (ref 0.1–0.9)
MONO%: 9.8 % (ref 0.0–13.0)
NEUT%: 66.8 % (ref 40.0–80.0)
NEUTROS ABS: 4.1 10*3/uL (ref 1.5–6.5)
Platelets: 176 10*3/uL (ref 145–400)
RBC: 5.14 10*6/uL (ref 4.20–5.70)
RDW: 12.2 % (ref 11.1–15.7)
WBC: 6.1 10*3/uL (ref 4.0–10.0)

## 2015-05-07 LAB — CMP (CANCER CENTER ONLY)
ALBUMIN: 4 g/dL (ref 3.3–5.5)
ALT(SGPT): 32 U/L (ref 10–47)
AST: 24 U/L (ref 11–38)
Alkaline Phosphatase: 48 U/L (ref 26–84)
BUN, Bld: 11 mg/dL (ref 7–22)
CALCIUM: 9.5 mg/dL (ref 8.0–10.3)
CHLORIDE: 103 meq/L (ref 98–108)
CO2: 30 mEq/L (ref 18–33)
CREATININE: 1.3 mg/dL — AB (ref 0.6–1.2)
Glucose, Bld: 102 mg/dL (ref 73–118)
POTASSIUM: 4 meq/L (ref 3.3–4.7)
Sodium: 141 mEq/L (ref 128–145)
TOTAL PROTEIN: 7.4 g/dL (ref 6.4–8.1)
Total Bilirubin: 0.9 mg/dl (ref 0.20–1.60)

## 2015-05-07 LAB — LACTATE DEHYDROGENASE: LDH: 159 U/L (ref 125–245)

## 2015-05-07 MED ORDER — IOPAMIDOL (ISOVUE-300) INJECTION 61%
100.0000 mL | Freq: Once | INTRAVENOUS | Status: AC | PRN
  Administered 2015-05-07: 100 mL via INTRAVENOUS

## 2015-05-07 NOTE — Progress Notes (Signed)
Hematology and Oncology Follow Up Visit  Dominic Anderson [id number] Oct 03, 1970 45 y.o. 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 He tolerated his this only cycle of chemotherapy. He had 1 cycle of BEP. He completed this in July 2016.  He feels great.  He is doing a lot of hunting. He actually is going hunting today. He absolutely loves [place] hunting. He showed me some pictures of a [place] that he killed with a crossbow.  He is working full time. He is quite busy at work.  His last tumor markers back in  In December showed an alpha-fetoprotein of 2.2 and beta-hCG less than 2.0.   He's had no change in bowel or bladder habits. He's had no rashes. He's had no leg swelling. He's had no cough. He's had no fever. He's had no significant fatigue or weakness.  Overall, his performance status is ECOG 0.  Medications:    Medication List    Notice  As of 05/07/2015 11:32 AM   You have not been prescribed any medications.      Allergies: No Known Allergies  Past Medical History, Surgical history, Social history, and Family History were reviewed and updated.  Review of Systems: All other 10 point review of systems is negative.   Physical Exam:  weight is 207 lb 1.9 oz (93.949 kg). His oral temperature is 97.5 F (36.4 C). His blood pressure is 128/88 and his pulse is 55.   Wt Readings from Last 3 Encounters:  05/07/15 207 lb 1.9 oz (93.949 kg)  12/25/14 204 lb (92.534 kg)  10/02/14 205 lb (92.987 kg)    Well-developed well-nourished white woman in no obvious distress. Head and neck exam shows no ocular or oral lesions. He has no adenopathy in the neck. Lungs are clear. Cardiac exam regular rate and rhythm with no murmurs, rubs or bruits. Abdomen is soft. Has good bowel sounds. He has well-healed right  inguinal orchiectomy scar. He has no palpable liver or spleen tip. Back exam shows no tenderness over the spine, ribs or hips. Extremities shows no clubbing, cyanosis or edema. Skin exam no rashes ecchymoses or petechia.  Lab Results  Component Value Date   WBC 6.1 05/07/2015   HGB 16.6 05/07/2015   HCT 48.0 05/07/2015   MCV 93 05/07/2015   PLT 176 05/07/2015   No results found for: FERRITIN, IRON, TIBC, UIBC, IRONPCTSAT Lab Results  Component Value Date   RBC 5.14 05/07/2015   No results found for: KPAFRELGTCHN, LAMBDASER, KAPLAMBRATIO No results found for: IGGSERUM, IGA, IGMSERUM No results found for: Odetta Pink, SPEI   Chemistry      Component Value Date/Time   NA 141 05/07/2015 0806   NA 140 09/23/2014 1514   K 4.0 05/07/2015 0806   K 3.9 09/23/2014 1514   CL 103 05/07/2015 0806   CL 105 09/23/2014 1514   CO2 30 05/07/2015 0806   CO2 25 09/23/2014 1514   BUN 11 05/07/2015 0806   BUN 17 09/23/2014 1514   CREATININE 1.3* 05/07/2015 0806   CREATININE 1.42* 09/23/2014 1514      Component Value Date/Time   CALCIUM 9.5 05/07/2015 0806   CALCIUM 9.0 09/23/2014 1514   ALKPHOS 48 05/07/2015 0806   ALKPHOS 37* 09/23/2014 1514  AST 24 05/07/2015 0806   AST 14 09/23/2014 1514   ALT 32 05/07/2015 0806   ALT 17 09/23/2014 1514   BILITOT 0.90 05/07/2015 0806   BILITOT 0.5 09/23/2014 1514     Impression and Plan: Dominic Anderson is a 45 year old gentleman with a stage I non-seminomatous germ cell tumor. He is s/p orchiectomy by Dr. Risa Grill.   He completed his adjuvant chemotherapy in early July 2016.  His CT scans are great. This 3 mm indeterminate pulmonary nodule does not really bother me. His tumor markers are normal.  I think that at this point, we just do a chest x-ray on him. I think that along with labs would be appropriate.  I will plan to see him back now in 6 months. Dominic Napoleon, MD 4/26/201711:32  AM

## 2015-05-08 LAB — BETA HCG QUANT (REF LAB): hCG Quant: <1 m[IU]/mL (ref 0–3)

## 2015-05-08 LAB — AFP TUMOR MARKER: AFP, Serum, Tumor Marker: 3.6 ng/mL (ref 0.0–8.3)

## 2015-05-09 ENCOUNTER — Telehealth: Payer: Self-pay | Admitting: Nurse Practitioner

## 2015-05-09 NOTE — Telephone Encounter (Addendum)
Pt verbalized understanding and appreciation. ----- Message from Volanda Napoleon, MD sent at 05/08/2015  7:39 AM EDT ----- Call - labs are great!!!  Bag a big [place] for me!!!  pete

## 2015-05-10 LAB — ALPHA FETO PROTEIN (PARALLEL TESTING): AFP-Tumor Marker: 3.1 ng/mL (ref <6.1)

## 2015-05-10 LAB — BETA HCG, QN (PARALLEL TESTING): Beta hCG, Tumor Marker: <2 m[IU]/mL (ref <5.0)

## 2015-11-12 ENCOUNTER — Ambulatory Visit: Payer: Self-pay | Admitting: Hematology & Oncology

## 2015-11-12 ENCOUNTER — Other Ambulatory Visit: Payer: Self-pay

## 2015-11-12 ENCOUNTER — Other Ambulatory Visit (HOSPITAL_BASED_OUTPATIENT_CLINIC_OR_DEPARTMENT_OTHER): Payer: Self-pay

## 2015-12-05 ENCOUNTER — Ambulatory Visit: Payer: Commercial Managed Care - HMO | Admitting: Hematology & Oncology

## 2015-12-05 ENCOUNTER — Other Ambulatory Visit: Payer: Commercial Managed Care - HMO

## 2015-12-05 ENCOUNTER — Other Ambulatory Visit (HOSPITAL_BASED_OUTPATIENT_CLINIC_OR_DEPARTMENT_OTHER): Payer: Self-pay

## 2016-06-23 ENCOUNTER — Other Ambulatory Visit: Payer: Self-pay | Admitting: Family

## 2016-06-23 ENCOUNTER — Ambulatory Visit (HOSPITAL_BASED_OUTPATIENT_CLINIC_OR_DEPARTMENT_OTHER)
Admission: RE | Admit: 2016-06-23 | Discharge: 2016-06-23 | Disposition: A | Discharge status: Home / Self Care | Payer: Commercial Managed Care - HMO | Source: Ambulatory Visit | Attending: Family | Admitting: Family

## 2016-06-23 ENCOUNTER — Other Ambulatory Visit (HOSPITAL_BASED_OUTPATIENT_CLINIC_OR_DEPARTMENT_OTHER): Payer: Commercial Managed Care - HMO

## 2016-06-23 ENCOUNTER — Ambulatory Visit (HOSPITAL_BASED_OUTPATIENT_CLINIC_OR_DEPARTMENT_OTHER): Payer: Commercial Managed Care - HMO | Admitting: Family

## 2016-06-23 VITALS — BP 128/82 | HR 79 | Temp 98.1°F | Resp 16 | Wt 208.0 lb

## 2016-06-23 DIAGNOSIS — C6201 Malignant neoplasm of undescended right testis: Secondary | ICD-10-CM | POA: Diagnosis not present

## 2016-06-23 DIAGNOSIS — R911 Solitary pulmonary nodule: Secondary | ICD-10-CM | POA: Diagnosis not present

## 2016-06-23 DIAGNOSIS — K6289 Other specified diseases of anus and rectum: Secondary | ICD-10-CM

## 2016-06-23 LAB — CMP (CANCER CENTER ONLY)
ALT(SGPT): 39 U/L (ref 10–47)
AST: 23 U/L (ref 11–38)
Albumin: 4.6 g/dL (ref 3.3–5.5)
Alkaline Phosphatase: 54 U/L (ref 26–84)
BUN: 12 mg/dL (ref 7–22)
CHLORIDE: 101 meq/L (ref 98–108)
CO2: 27 meq/L (ref 18–33)
CREATININE: 1.3 mg/dL — AB (ref 0.6–1.2)
Calcium: 10 mg/dL (ref 8.0–10.3)
Glucose, Bld: 103 mg/dL (ref 73–118)
Potassium: 4.1 mEq/L (ref 3.3–4.7)
Sodium: 141 mEq/L (ref 128–145)
TOTAL PROTEIN: 7.9 g/dL (ref 6.4–8.1)
Total Bilirubin: 1.1 mg/dl (ref 0.20–1.60)

## 2016-06-23 LAB — CBC WITH DIFFERENTIAL (CANCER CENTER ONLY)
BASO#: 0 10*3/uL (ref 0.0–0.2)
BASO%: 0.5 % (ref 0.0–2.0)
EOS ABS: 0.1 10*3/uL (ref 0.0–0.5)
EOS%: 1.5 % (ref 0.0–7.0)
HCT: 49.3 % (ref 38.7–49.9)
HGB: 17 g/dL (ref 13.0–17.1)
LYMPH#: 1.7 10*3/uL (ref 0.9–3.3)
LYMPH%: 25 % (ref 14.0–48.0)
MCH: 32.1 pg (ref 28.0–33.4)
MCHC: 34.5 g/dL (ref 32.0–35.9)
MCV: 93 fL (ref 82–98)
MONO#: 0.6 10*3/uL (ref 0.1–0.9)
MONO%: 9.2 % (ref 0.0–13.0)
NEUT#: 4.2 10*3/uL (ref 1.5–6.5)
NEUT%: 63.8 % (ref 40.0–80.0)
PLATELETS: 204 10*3/uL (ref 145–400)
RBC: 5.3 10*6/uL (ref 4.20–5.70)
RDW: 12.1 % (ref 11.1–15.7)
WBC: 6.6 10*3/uL (ref 4.0–10.0)

## 2016-06-23 LAB — LACTATE DEHYDROGENASE: LDH: 185 U/L (ref 125–245)

## 2016-06-23 NOTE — Progress Notes (Signed)
Hematology and Oncology Follow Up Visit  Dimitrious Micciche [id number] 05/22/1970 46 y.o. 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 Bodi is here today with his wife to re-establish care. He states that he noticed a nontender "pea sized lump" in his rectum while in the shower. He refused physical exam by myself or Dr. Marin Olp. He would prefer a scan.  We have rechecked his tumor markers today and results are pending.   No episodes of bleeding, bruising or petechiae. No lymphadenopathy found on exam.  He denies fever, chills, n/v, cough, rash, dizziness, SOB, chest pain, palpitations, abdominal pain or changes in bowel or bladder habits.  No swelling, tenderness, numbness or tingling in his extremities. No c/o pain at this time.  He has maintained a good appetite and is staying well hydrated. His weight is stable.   ECOG Performance Status: 1 - Symptomatic but completely ambulatory  Medications:  Allergies as of 06/23/2016   No Known Allergies     Medication List    as of 06/23/2016 11:20 AM   You have not been prescribed any medications.     Allergies: No Known Allergies  Past Medical History, Surgical history, Social history, and Family History were reviewed and updated.  Review of Systems: All other 10 point review of systems is negative.   Physical Exam:  vitals were not taken for this visit.  Wt Readings from Last 3 Encounters:  05/07/15 207 lb 1.9 oz (93.9 kg)  12/25/14 204 lb (92.5 kg)  10/02/14 205 lb (93 kg)    Ocular: Sclerae unicteric, pupils equal, round and reactive to light Ear-nose-throat: Oropharynx clear, dentition fair Lymphatic: No cervical, supraclavicular or axillary adenopathy Lungs no rales or rhonchi, good excursion bilaterally Heart regular rate and rhythm, no murmur  appreciated Abd soft, nontender, positive bowel sounds, no liver or spleen tip palpated on exam, no fluid wave MSK no focal spinal tenderness, no joint edema Neuro: non-focal, well-oriented, appropriate affect Breasts: Deferred   Lab Results  Component Value Date   WBC 6.6 06/23/2016   HGB 17.0 06/23/2016   HCT 49.3 06/23/2016   MCV 93 06/23/2016   PLT 204 06/23/2016   No results found for: FERRITIN, IRON, TIBC, UIBC, IRONPCTSAT Lab Results  Component Value Date   RBC 5.30 06/23/2016   No results found for: KPAFRELGTCHN, LAMBDASER, KAPLAMBRATIO No results found for: IGGSERUM, IGA, IGMSERUM No results found for: Odetta Pink, SPEI   Chemistry      Component Value Date/Time   NA 141 05/07/2015 0806   K 4.0 05/07/2015 0806   CL 103 05/07/2015 0806   CO2 30 05/07/2015 0806   BUN 11 05/07/2015 0806   CREATININE 1.3 (H) 05/07/2015 0806      Component Value Date/Time   CALCIUM 9.5 05/07/2015 0806   ALKPHOS 48 05/07/2015 0806   AST 24 05/07/2015 0806   ALT 32 05/07/2015 0806   BILITOT 0.90 05/07/2015 0806      Impression and Plan: Mr. Monts is a pleasant 46 yo caucasian male with history of stage I non-seminomatous germ cell tumor with radical right orchiectomy in May 2016 followed by adjuvant chemo completed in July 2016. We have not seen him in over a year and he is here today today to re-establish care.  He has c/o a "pea sized lump" inside his rectum. This is non tender.  We have checked his tumor marker and results are pending. CBC and CMP are stable.  We will get a CT or the pelvis to better evaluate and also a chest xray to see if pulmonary nodule see on CT last year is still present.  Both he and his wife are in agreement with the plan. We will follow-up with them once we have all these results.  They know to contact our office with any other questions or concerns. We can certainly see him sooner if need be.    Eliezer Bottom, NP 6/13/201811:20 AM

## 2016-06-24 ENCOUNTER — Telehealth: Payer: Self-pay | Admitting: *Deleted

## 2016-06-24 LAB — BETA HCG QUANT (REF LAB): hCG Quant: <1 m[IU]/mL (ref 0–3)

## 2016-06-24 LAB — AFP TUMOR MARKER: AFP, Serum, Tumor Marker: 3.8 ng/mL (ref 0.0–8.3)

## 2016-06-24 NOTE — Telephone Encounter (Addendum)
Patient is aware of results  ----- Message from Eliezer Bottom, NP sent at 06/24/2016 11:06 AM EDT ----- Regarding: Tumor markers Tumor markers look great! Thank you!  Dominic Anderson  ----- Message ----- From: Interface, Lab In Three Zero One Sent: 06/23/2016  11:12 AM To: Eliezer Bottom, NP

## 2016-06-25 ENCOUNTER — Telehealth: Payer: Self-pay

## 2016-06-25 ENCOUNTER — Ambulatory Visit (HOSPITAL_BASED_OUTPATIENT_CLINIC_OR_DEPARTMENT_OTHER): Payer: 59

## 2016-06-25 NOTE — Telephone Encounter (Signed)
Patient called office to see if he needs to keep CT appt since his lab results were normal. Per Dr Marin Olp, proceed with CT scan. Pt states he has a scheduling conflict and will r/s to next week.  Pt also notified of CXR results per Judson Roch, NP "Xray negative, no nodule seen. Thank you!   Sarah"  Pt verbalizes understanding. dph

## 2016-06-29 ENCOUNTER — Ambulatory Visit (HOSPITAL_BASED_OUTPATIENT_CLINIC_OR_DEPARTMENT_OTHER)
Admission: RE | Admit: 2016-06-29 | Discharge: 2016-06-29 | Disposition: A | Discharge status: Home / Self Care | Payer: 59 | Source: Ambulatory Visit | Attending: Family | Admitting: Family

## 2016-06-29 DIAGNOSIS — K629 Disease of anus and rectum, unspecified: Secondary | ICD-10-CM | POA: Diagnosis present

## 2016-06-29 DIAGNOSIS — M461 Sacroiliitis, not elsewhere classified: Secondary | ICD-10-CM | POA: Insufficient documentation

## 2016-06-29 DIAGNOSIS — K6289 Other specified diseases of anus and rectum: Secondary | ICD-10-CM

## 2016-06-29 DIAGNOSIS — C6201 Malignant neoplasm of undescended right testis: Secondary | ICD-10-CM | POA: Diagnosis not present

## 2016-06-29 MED ORDER — IOPAMIDOL (ISOVUE-300) INJECTION 61%
100.0000 mL | Freq: Once | INTRAVENOUS | Status: AC | PRN
  Administered 2016-06-29: 100 mL via INTRAVENOUS

## 2016-06-30 ENCOUNTER — Other Ambulatory Visit: Payer: Self-pay | Admitting: Family

## 2016-06-30 ENCOUNTER — Telehealth: Payer: Self-pay | Admitting: Family

## 2016-06-30 DIAGNOSIS — C6201 Malignant neoplasm of undescended right testis: Secondary | ICD-10-CM

## 2016-06-30 NOTE — Telephone Encounter (Signed)
I spoke with Dominic Anderson and let him know that his CT scan showed no evidence of metastatic disease in the pelvis. He is asymptomatic at this time and has no complaints. We will continue to follow-up and plan to see him back in another 6 months. I conferred with Dr. Marin Olp and if the patient becomes symptomatic we will refer him to GI for further work up and possible proctoscopy.

## 2016-12-31 ENCOUNTER — Other Ambulatory Visit: Payer: Commercial Managed Care - HMO

## 2016-12-31 ENCOUNTER — Ambulatory Visit: Payer: Self-pay | Admitting: Family

## 2017-09-02 IMAGING — CT CT PELVIS W/ CM
2 of 3 series · 16 of 46 positions shown, 18 images · IV contrast (iopamidol)
Comparison: Pelvic CT 05/07/2015 and 12/25/2014.

CLINICAL DATA: Palpable pea-sized lump in the rectum noted 2 weeks
ago. History of testicular cancer with orchidectomy. Previous
appendectomy.

EXAM:
CT PELVIS WITH CONTRAST
TECHNIQUE: Multidetector CT imaging of the pelvis was performed using the
standard protocol following the bolus administration of intravenous
contrast.
CONTRAST:  100mL [id number]-[id number] IOPAMIDOL ([id number]-[id number]) INJECTION 61%

[Series 5: axial soft tissue · axial · 0.83mm/px · z∈[-414,-114]mm · 13 of 174 slices shown, 15 images]
[im 12/174  soft-tissue]
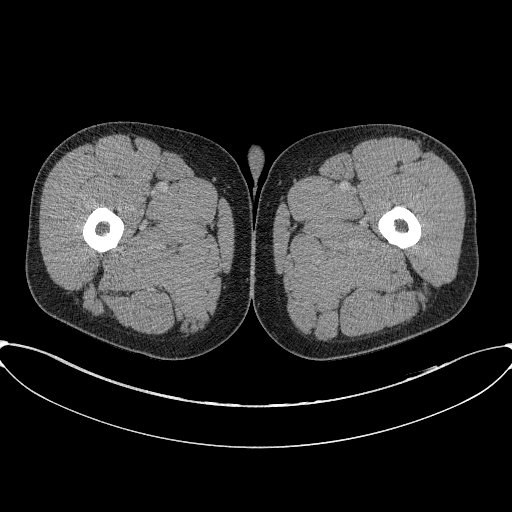
[im 12/174  bone]
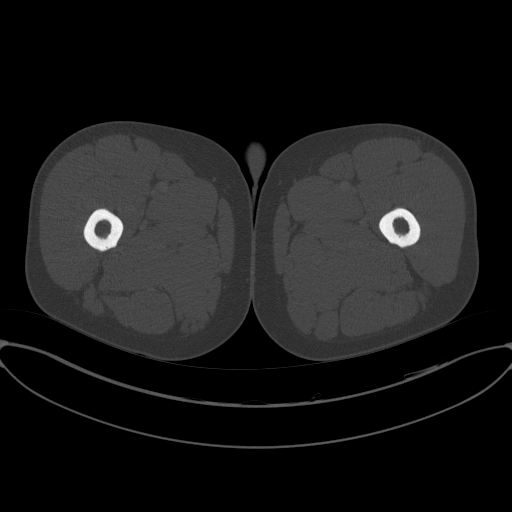
[im 23/174  soft-tissue]
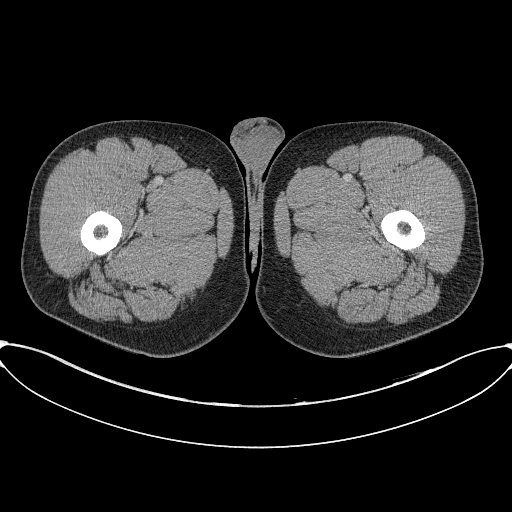
[im 34/174  soft-tissue]
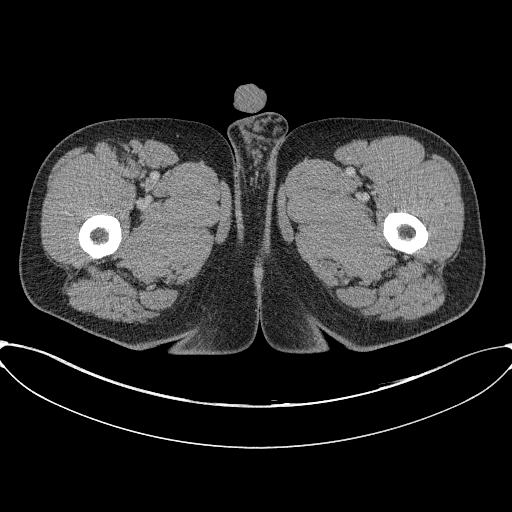
[im 51/174  soft-tissue]
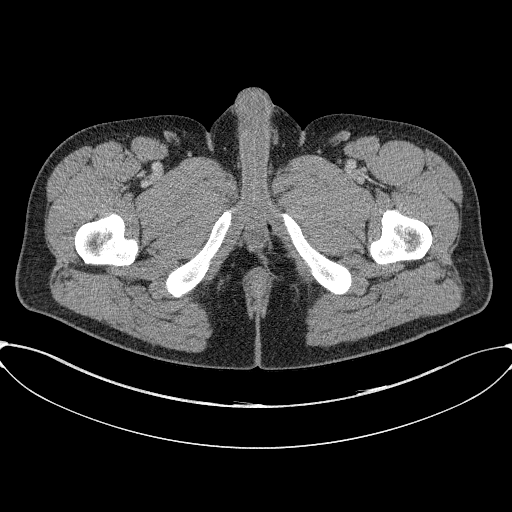
[im 62/174  soft-tissue]
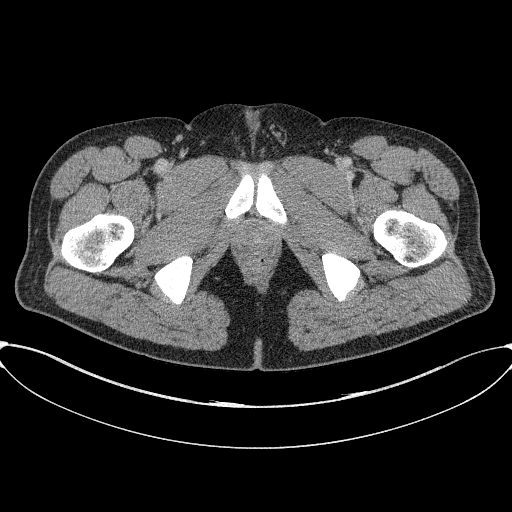
[im 73/174  soft-tissue]
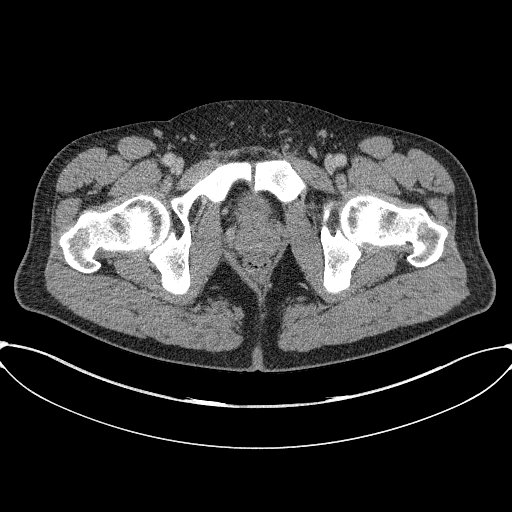
[im 90/174  soft-tissue]
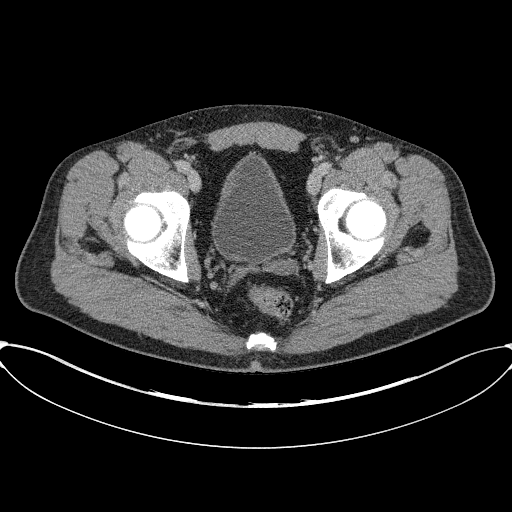
[im 101/174  soft-tissue]
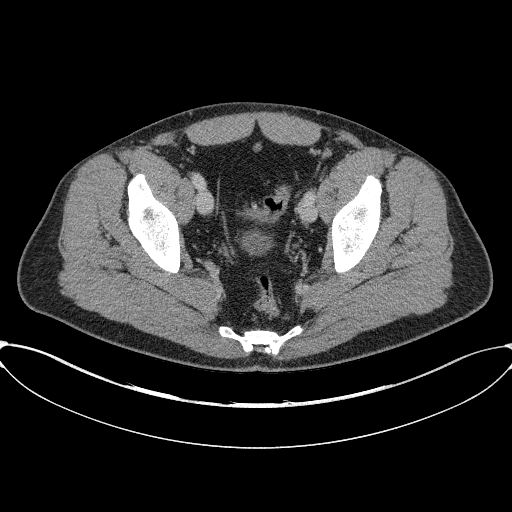
[im 112/174  soft-tissue]
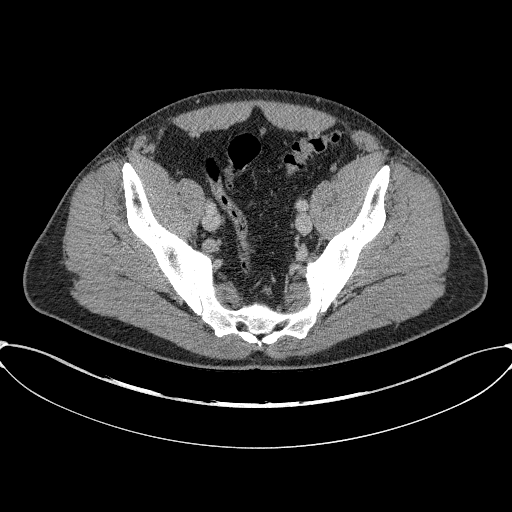
[im 112/174  bone]
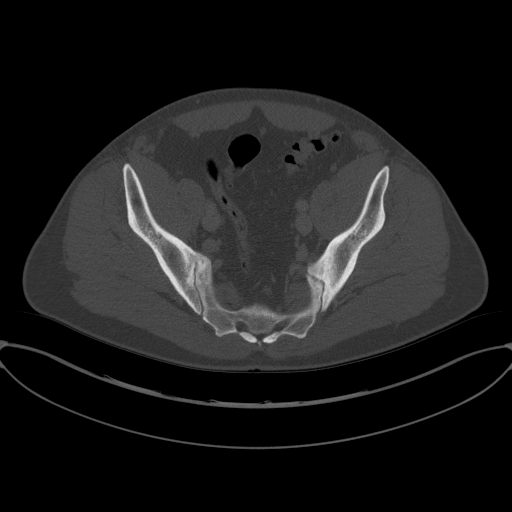
[im 123/174  soft-tissue]
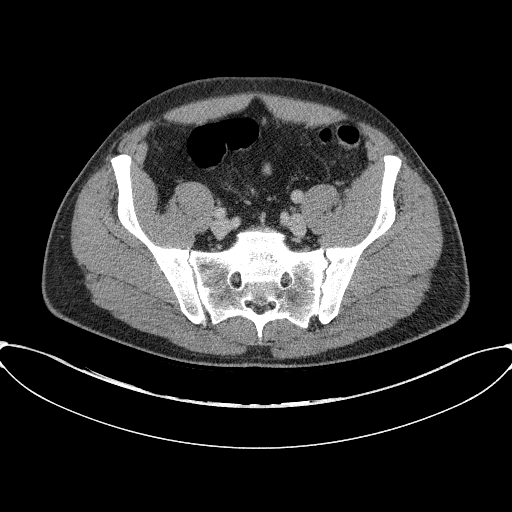
[im 140/174  soft-tissue]
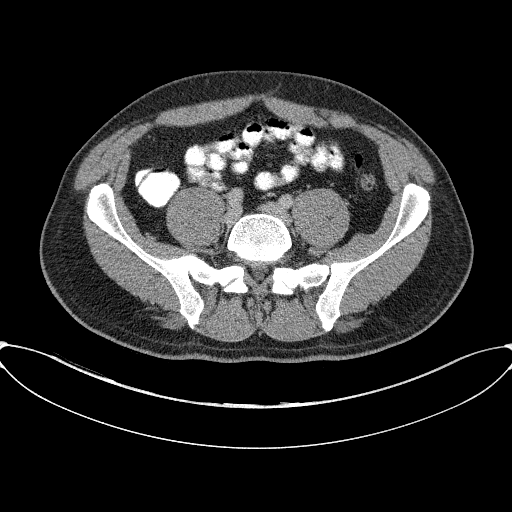
[im 151/174  soft-tissue]
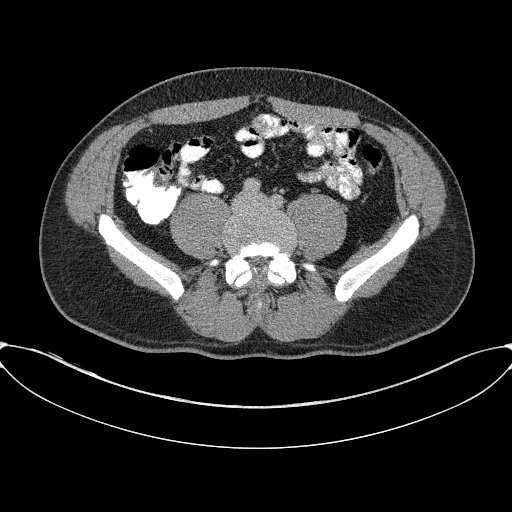
[im 162/174  soft-tissue]
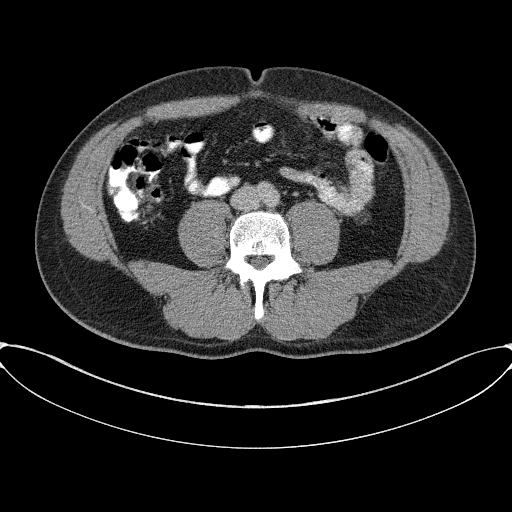

[Series 8: coronal st · coronal · 0.68mm/px · 3 of 137 slices shown]
[im 46/137  soft-tissue]
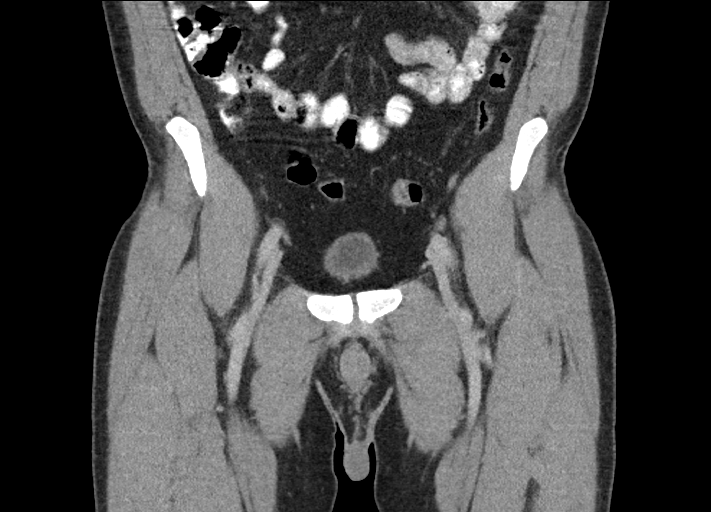
[im 61/137  soft-tissue]
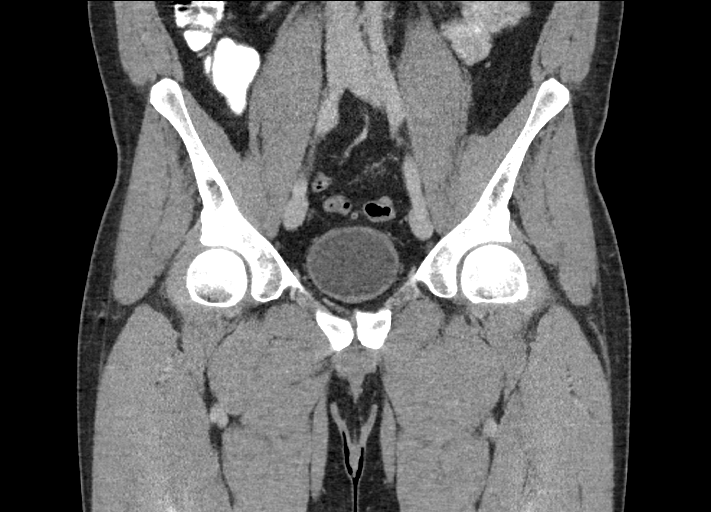
[im 76/137  soft-tissue]
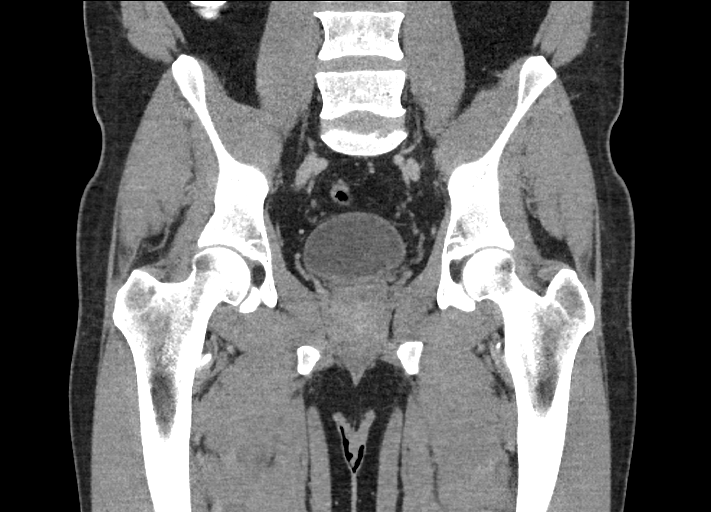

[16 of 46 positions shown; findings below may reference images not displayed]

FINDINGS: Urinary Tract: The visualized distal ureters and bladder appear
unremarkable.

Bowel: No bowel wall thickening, distention or surrounding
inflammation identified within the pelvis. Mild sigmoid colon
diverticular changes. The enteric contrast has not yet entered the
rectum. No rectal or perirectal mass identified.

Vascular/Lymphatic: No enlarged pelvic lymph nodes identified. No
significant vascular findings.

Reproductive: The prostate gland and seminal vesicles appear normal.
Previous right orchidectomy noted.

Other: No ascites.

Musculoskeletal: No acute findings or evidence of metastatic
disease. Evidence of bilateral sacroiliitis. Focal atrophy of the
proximal right rectus femoris muscle consistent with old injury.
IMPRESSION: 1. No evidence of pelvic metastatic disease or explanation for the
patient's symptoms. Evaluation of the rectum is limited by this
examination has contrast has not yet entered the rectum. If further
evaluation is warranted based on clinical examination, proctoscopy
should be considered.
2. Evidence of chronic bilateral sacroiliitis.

## 2018-08-08 ENCOUNTER — Emergency Department (HOSPITAL_BASED_OUTPATIENT_CLINIC_OR_DEPARTMENT_OTHER)
Admission: EM | Admit: 2018-08-08 | Discharge: 2018-08-09 | Disposition: A | Discharge status: Home / Self Care | Payer: BC Managed Care – PPO | Attending: Emergency Medicine | Admitting: Emergency Medicine

## 2018-08-08 ENCOUNTER — Other Ambulatory Visit: Payer: Self-pay

## 2018-08-08 DIAGNOSIS — N4889 Other specified disorders of penis: Secondary | ICD-10-CM | POA: Insufficient documentation

## 2018-08-08 DIAGNOSIS — Z8547 Personal history of malignant neoplasm of testis: Secondary | ICD-10-CM | POA: Diagnosis not present

## 2018-08-09 ENCOUNTER — Encounter (HOSPITAL_BASED_OUTPATIENT_CLINIC_OR_DEPARTMENT_OTHER): Payer: Self-pay | Admitting: Emergency Medicine

## 2018-08-09 NOTE — ED Provider Notes (Signed)
1:40 AM  Patient is a 48 year old male who was sent as a transfer from [place] with concerns for penile injury.  States that he was having intercourse with his wife when his penis came out and hit his wife's perineal area and he had pain with a large amount of bleeding and no deformity.  He is not having pain currently.  No current erection.  Urology to evaluate patient in the ED.  Patient refuses GU exam at this time.  2:25 AM  Seen by Urology.  Per urology note -   "-Obtain PVR. If <100cc okay to discharge from urology perspective.  No plan for penile exploration in the OR  - Return precautions given for inability to void, persistent hematuria  -Told to call the office and set up an appointment for 2 weeks for post void residual, urinalysis, symptom check."  2:45 AM  Pt's post void residual is 0.  Will discharge home with outpatient urology follow-up.   I reviewed all nursing notes, vitals, pertinent previous records, EKGs, lab and urine results, imaging (as available).    Ward, Delice Bison, DO 08/09/18 [phone]

## 2018-08-09 NOTE — ED Triage Notes (Signed)
Noticed blood during sex just pt in penis area.

## 2018-08-09 NOTE — ED Notes (Signed)
Dressing applied per verbal orders from urologist.

## 2018-08-09 NOTE — Discharge Instructions (Addendum)
If you are unable to urinate or continue to urinate blood, please return to the emergency department.  Please schedule an appointment for recheck with the urologist in 2 weeks.  There was no sign of penile fracture or ruptured blood vessel today based on urology examination.  You may alternate Tylenol and ibuprofen as needed for pain.

## 2018-08-09 NOTE — ED Provider Notes (Signed)
Emergency Department Provider Note   I have reviewed the triage vital signs and the nursing notes.   HISTORY  Chief Complaint Penis Injury   HPI Dominic Anderson is a 48 y.o. male with history of testicular cancer status post resection currently in recovery who presents the emergency department today with penile bleeding after intercourse.  He states that he was having intercourse with his wife and his penis came out in the wife's perineal area and had instant split-second of pain followed by a large amount of bleeding without evidence of deformity.  Had detumescence at that time and continued to bleed so came here for further evaluation.  At this time patient has no pain is a little bit of bruising around his urethra and oozing of blood but no pain or deformity.  No trauma or pain elsewhere.  Has not voided since this happened.   No other associated or modifying symptoms.    Past Medical History:  Diagnosis Date  . Testicle cancer ([place])   . Testicular mass    right    Patient Active Problem List   Diagnosis Date Noted  . Neutropenic fever ([place]) 07/09/2014  . Testicular cancer ([place]) 05/20/2014  . Avulsion of nail plate 01/26/2013  . Laceration of nail bed of finger 01/26/2013  . Finger laceration 01/26/2013    Past Surgical History:  Procedure Laterality Date  . APPENDECTOMY  2000  . ORCHIECTOMY Right 05/20/2014   Procedure: RADICAL ORCHIECTOMY RIGHT ;  Surgeon: Rana Snare, MD;  Location: [facility];  Service: Urology;  Laterality: Right;      Allergies Patient has no known allergies.  No family history on file.  Social History Social History   Tobacco Use  . Smoking status: Never Smoker  . Smokeless tobacco: Never Used  . Tobacco comment: NEVER USED TOBACCO  Substance Use Topics  . Alcohol use: No    Alcohol/week: 0.0 standard drinks  . Drug use: No    Review of Systems  All other systems negative except as documented in the HPI. All  pertinent positives and negatives as reviewed in the HPI. ____________________________________________   PHYSICAL EXAM:  VITAL SIGNS: ED Triage Vitals  Enc Vitals Group     BP 08/09/18 0000 (!) 160/108     Pulse Rate 08/09/18 0000 (!) 102     Resp 08/09/18 0000 20     Temp 08/09/18 0000 98.2 F (36.8 C)     Temp Source 08/09/18 0000 Oral     SpO2 08/09/18 0000 99 %     Weight 08/09/18 0001 205 lb (93 kg)    Constitutional: Alert and oriented. Well appearing and in no acute distress. Eyes: Conjunctivae are normal. PERRL. EOMI. Head: Atraumatic. Nose: No congestion/rhinnorhea. Mouth/Throat: Mucous membranes are moist.  Oropharynx non-erythematous. Neck: No stridor.  No meningeal signs.   Cardiovascular: Normal rate, regular rhythm. Good peripheral circulation. Grossly normal heart sounds.   Respiratory: Normal respiratory effort.  No retractions. Lungs CTAB. Gastrointestinal: Soft and nontender. No distention.  GU: ecchymosis around urethra with mild oozing. No obvious deformities or palpable swelling/hematoma, no ttp. Musculoskeletal: No lower extremity tenderness nor edema. No gross deformities of extremities. Neurologic:  Normal speech and language. No gross focal neurologic deficits are appreciated.  Skin:  Skin is warm, dry and intact. No rash noted.   ____________________________________________   INITIAL IMPRESSION / ASSESSMENT AND PLAN / ED COURSE  Penile fracture vs ruptured vessel of some sort. Discussed with urology, Dr. Anthony Sar who  requests transfer to [facility] ED for evaluation. Patient stable and to go via POV. Informed charge nurse. Also informed Dr. Leonides Schanz of his pending arrival.   Pertinent labs & imaging results that were available during my care of the patient were reviewed by me and considered in my medical decision making (see chart for details).  ____________________________________________  FINAL CLINICAL IMPRESSION(S) / ED DIAGNOSES  Final diagnoses:  Penile  bleeding     MEDICATIONS GIVEN DURING THIS VISIT:  Medications - No data to display   NEW OUTPATIENT MEDICATIONS STARTED DURING THIS VISIT:  New Prescriptions   No medications on file    Note:  This note was prepared with assistance of Dragon voice recognition software. Occasional wrong-word or sound-a-like substitutions may have occurred due to the inherent limitations of voice recognition software.   Brynda Heick, Corene Cornea, MD 08/09/18 [phone]

## 2018-08-09 NOTE — Consult Note (Signed)
Urology Consult Note   Requesting Attending Physician:  Ward, Delice Bison, DO Service Providing Consult: Urology  Consulting Attending: Louis Meckel   Reason for Consult: Concern for penile injury  HPI: Dominic Anderson is seen in consultation for reasons noted above at the request of Ward, Delice Bison, DO   This is a 48 y.o. male with history of testicular cancer status post orchiectomy 2016 who now presents due to penile trauma  Was having intercourse with his wife around midnight when he accidentally pulled out and hit her perineum with a "glancing blow".  Did not hear a pop.  Did not have immediate detumescence but did note blood coming from his urethra and needed to apply pressure to the tip of his penis.  This caused detumescence.  Presented to [facility] emergency room where the provider was unsure if he had a penile fracture.  He was transferred to [facility] long for further evaluation.  Here in the [facility] ED he is able to void volitionally about 200c of light red urine without clot. PVR pending. Feels like he is emptying.  Exam is NOT consistent with penile Fracture.  No pain to palpation of either corpora or spongiosum.  There is 1 area of ecchymosis on the ventral distal aspect of his glans which is likely a superficial bruise. No meatal tear.  He may have some urethral irritation distally which is causing bleeding but his exam is not consistent with a true urethral injury.   Past Medical History: Past Medical History:  Diagnosis Date  . Testicle cancer ([place])   . Testicular mass    right    Past Surgical History:  Past Surgical History:  Procedure Laterality Date  . APPENDECTOMY  2000  . ORCHIECTOMY Right 05/20/2014   Procedure: RADICAL ORCHIECTOMY RIGHT ;  Surgeon: Rana Snare, MD;  Location: [facility];  Service: Urology;  Laterality: Right;    Medication: No current facility-administered medications for this encounter.    No current outpatient medications on file.     Allergies: No Known Allergies  Social History: Social History   Tobacco Use  . Smoking status: Never Smoker  . Smokeless tobacco: Never Used  . Tobacco comment: NEVER USED TOBACCO  Substance Use Topics  . Alcohol use: No    Alcohol/week: 0.0 standard drinks  . Drug use: No    Family History No family history on file.  Review of Systems 10 systems were reviewed and are negative except as noted specifically in the HPI.  Objective   Vital signs in last 24 hours: BP (!) 160/108 (BP Location: Left Arm)   Pulse (!) 102   Temp 98.2 F (36.8 C) (Oral)   Resp 20   Wt 93 kg   SpO2 99%   BMI 29.41 kg/m   Physical Exam General Appearance:  No acute distress. Alert and oriented x 3. Pulmonary: Normal respiratory effort on room air Cardiovascular: Regular rate Abdomen: Soft, non-tender, without masses. Musculoskeletal: Normal gait. Extremities without edema. GU:  No pain to palpation of either corpora or spongiosum.  There is 1 area of ecchymosis on the ventral distal aspect of his glans which is likely a superficial bruise. No meatal tear. (Declined picture for chart) Neurologic:  No motor abnormalities noted.    Most Recent Labs: Lab Results  Component Value Date   WBC 6.6 06/23/2016   HGB 17.0 06/23/2016   HCT 49.3 06/23/2016   PLT 204 06/23/2016    Lab Results  Component Value Date  NA 141 06/23/2016   K 4.1 06/23/2016   CL 101 06/23/2016   CO2 27 06/23/2016   BUN 12 06/23/2016   CREATININE 1.3 (H) 06/23/2016   CALCIUM 10.0 06/23/2016    Lab Results  Component Value Date   INR 0.9 (L) 07/04/2014     IMAGING: No results found.  ------  Assessment:  48 y.o. male with penile bruising but no evidence of penile fracture.  Able to void in the emergency room   Recommendations: -Obtain PVR. If <100cc okay to discharge from urology perspective.  No plan for penile exploration in the OR  - Return precautions given for inability to void,  persistent hematuria  -Told to call the office and set up an appointment for 2 weeks for post void residual, urinalysis, symptom check.   Thank you for this consult. Please contact the urology consult pager with any further questions/concerns.

## 2018-08-09 NOTE — ED Notes (Signed)
Contacted Urology (Dr. Anthony Sar) @ [phone]

## 2018-08-09 NOTE — ED Notes (Addendum)
Jacob Moores, CNA bladder scanned pt per verbal order of urologist. Result was zero. Urologist and MD made aware.

## 2018-08-09 NOTE — ED Notes (Signed)
Dr. Anthony Sar paged and call return notified of pt being in room 7
